# Patient Record
Sex: Male | Born: 1970 | Race: Black or African American | Hispanic: No | Marital: Single | Smoking: Current every day smoker
Health system: Southern US, Community
[De-identification: ages and names within clinical notes are randomized; demographics above are authoritative.]

## PROBLEM LIST (undated history)

## (undated) DIAGNOSIS — G709 Myoneural disorder, unspecified: Secondary | ICD-10-CM

## (undated) DIAGNOSIS — M1990 Unspecified osteoarthritis, unspecified site: Secondary | ICD-10-CM

## (undated) HISTORY — DX: Unspecified osteoarthritis, unspecified site: M19.90

---

## 2005-06-25 HISTORY — PX: KNEE SURGERY: SHX244

## 2009-06-25 HISTORY — PX: KNEE SURGERY: SHX244

## 2010-06-25 HISTORY — PX: KNEE SURGERY: SHX244

## 2011-01-09 ENCOUNTER — Emergency Department: Payer: Self-pay | Admitting: Emergency Medicine

## 2011-02-05 ENCOUNTER — Ambulatory Visit: Payer: Self-pay

## 2014-12-24 DIAGNOSIS — M1990 Unspecified osteoarthritis, unspecified site: Secondary | ICD-10-CM | POA: Insufficient documentation

## 2014-12-29 ENCOUNTER — Encounter: Payer: Self-pay | Admitting: Family Medicine

## 2014-12-29 ENCOUNTER — Ambulatory Visit (INDEPENDENT_AMBULATORY_CARE_PROVIDER_SITE_OTHER): Payer: Medicare Other | Admitting: Family Medicine

## 2014-12-29 VITALS — BP 143/88 | HR 71 | Temp 98.7°F | Wt 169.0 lb

## 2014-12-29 DIAGNOSIS — R195 Other fecal abnormalities: Secondary | ICD-10-CM | POA: Insufficient documentation

## 2014-12-29 DIAGNOSIS — R634 Abnormal weight loss: Secondary | ICD-10-CM | POA: Diagnosis not present

## 2014-12-29 DIAGNOSIS — R030 Elevated blood-pressure reading, without diagnosis of hypertension: Secondary | ICD-10-CM | POA: Diagnosis not present

## 2014-12-29 DIAGNOSIS — M2391 Unspecified internal derangement of right knee: Secondary | ICD-10-CM | POA: Insufficient documentation

## 2014-12-29 DIAGNOSIS — G4701 Insomnia due to medical condition: Secondary | ICD-10-CM | POA: Diagnosis not present

## 2014-12-29 DIAGNOSIS — G8929 Other chronic pain: Secondary | ICD-10-CM | POA: Insufficient documentation

## 2014-12-29 DIAGNOSIS — I10 Essential (primary) hypertension: Secondary | ICD-10-CM

## 2014-12-29 DIAGNOSIS — K30 Functional dyspepsia: Secondary | ICD-10-CM | POA: Diagnosis not present

## 2014-12-29 DIAGNOSIS — IMO0001 Reserved for inherently not codable concepts without codable children: Secondary | ICD-10-CM | POA: Insufficient documentation

## 2014-12-29 MED ORDER — TRAZODONE HCL 50 MG PO TABS: ORAL_TABLET | ORAL | Status: DC

## 2014-12-29 MED ORDER — GABAPENTIN 100 MG PO CAPS: ORAL_CAPSULE | ORAL | Status: DC

## 2014-12-29 NOTE — Assessment & Plan Note (Signed)
Start trazodone, 50 mg and then increase to 100 mg if needed; avoid caffeine; do not mix with alcohol or sleeping pills

## 2014-12-29 NOTE — Assessment & Plan Note (Signed)
Sees Dr. Johny Blamer; will start gabapentin for pain; offered referral to pain clinic and he politely declined; he will see Dr. Johny Blamer; explained never stop gabapentin abruptly, would need taper down when down

## 2014-12-29 NOTE — Progress Notes (Signed)
BP 143/88 mmHg  Pulse 71  Temp(Src) 98.7 F (37.1 C)  Wt 169 lb (76.658 kg)  SpO2 98%   Subjective:    Patient ID: Timothy Mcknight, male    DOB: 05/25/1971, 44 y.o.   MRN: [id number]  HPI: Timothy Mcknight is a 44 y.o. male  Chief Complaint  Patient presents with  . Knee Pain  . Insomnia    due to knee pain   He is constantly in pain; can't sleep like he used to because of the pain Pain wakes him up at night; he can't ever get comfortable enough to relax Thinks the pain is running his BP up; does not check his BP away from her Currently using voltaren gel; it's very temporary; lasts for a few hours and has to put it on again; using it about four times a day he can't eat like his used to, gaining weight but hard to eat sometimes; food does not stick; no heartburn; just a little stomach ache; likes to eat veggies Stools have been runny; stools are smaller in caliber; good BM today; most of the time they are like pebbles; dark and little; no family hx of colon problems No personal history of ulcer; no issues with acid reflux or heartburn Dr. Johny Blamer is his orthopaedist; he did the injections, but it made him worse; the physical therapy causes pain in the groin; he has been through this for fifteen years now; he says the foot gets numb when standing up too long He does not fry food, boils food; not adding salt; no decongestants; smokes, picked up on the cigarettes, try to limit to 1/2 ppd, but stress...; stress is a killer; he can stop smoking at dark Mother has high blood pressure; taking medicine for years He wants to take something for sleep; they used to give him pills that knocked him out, something knocked him out  Relevant past medical, surgical, family and social history reviewed and updated as indicated. Interim medical history since our last visit reviewed. Allergies and medications reviewed and updated.  Review of Systems  Per HPI unless specifically indicated above      Objective:    BP 143/88 mmHg  Pulse 71  Temp(Src) 98.7 F (37.1 C)  Wt 169 lb (76.658 kg)  SpO2 98%  Wt Readings from Last 3 Encounters:  12/29/14 169 lb (76.658 kg)  08/04/14 185 lb (83.915 kg)    Physical Exam  Constitutional: He appears well-developed and well-nourished. No distress.  HENT:  Head: Normocephalic.  Eyes: EOM are normal. No scleral icterus.  Neck: No thyromegaly present.  Cardiovascular: Normal rate and regular rhythm.   No murmur heard. Pulmonary/Chest: Effort normal and breath sounds normal. No respiratory distress.  Abdominal: Soft. Bowel sounds are normal. He exhibits no distension. There is no tenderness. There is no guarding.  Musculoskeletal: He exhibits no edema.  Brace on the right knee; crepitus with flexion and extension; limited extension; no effusion; brace note removed  Neurological: He is alert.  Skin: Skin is warm and dry. He is not diaphoretic. No pallor.  Psychiatric: He has a normal mood and affect. His behavior is normal. Judgment and thought content normal.    No results found for this or any previous visit.    Assessment & Plan:   Problem List Items Addressed This Visit      Musculoskeletal and Integument   Internal derangement of right knee    Sees Dr. Johny Blamer; will start gabapentin for pain;  offered referral to pain clinic and he politely declined; he will see Dr. Johny Blamer; explained never stop gabapentin abruptly, would need taper down when down        Other   Unintentional weight loss    Patient not sure if last weight of 185 pounds was correct; he thinks he has actually gained weight; will check TSH      Relevant Orders   Helicobacter Pylori Antibody, IGM   H. pylori antibody, IgG   H. 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 pylori antibody, IgG   H. pylori antibody, IgA   Comprehensive metabolic panel   TSH   Ambulatory referral to Gastroenterology   Stomach upset    With possible weight loss (we're not sure about reliability of last weight entered); abnormal stools; H pylori testing; refer to GI; on topical NSAID      Relevant Orders   Helicobacter Pylori Antibody, IGM   H. pylori antibody, IgG   H. pylori antibody, IgA   Comprehensive metabolic panel   TSH   Ambulatory referral to Gastroenterology   Elevated systolic blood pressure    May be related to his pain and poor sleep; no meds today; DASH guidelines; reassess at f/u          Follow up plan: Return in about 1 month (around 01/29/2015) for insomnia, blood pressure, stomach issues. Orders Placed This Encounter  Procedures  . Helicobacter Pylori Antibody, IGM  . H. pylori antibody, IgG  . H. pylori antibody, IgA  . CBC with Differential/Platelet  . Comprehensive metabolic panel    Order Specific Question:  Has the patient fasted?    Answer:  Yes  . TSH  . Ambulatory referral to Gastroenterology    Referral Priority:  Routine    Referral Type:  Consultation    Referral Reason:  Specialty Services Required    Number of Visits Requested:  1

## 2014-12-29 NOTE — Assessment & Plan Note (Addendum)
Refer to GI for evaluation; male over 54 of African descent, would suggest colonoscopy

## 2014-12-29 NOTE — Patient Instructions (Addendum)
I've put in a referral to the gastroenterologist for you If you have not heard anything from my staff in a week about any orders/referrals/studies from today, please contact us here to follow-up [phone]  Use the gabapentin for pain relief; start with just one pill a day and increase by one pill every three days to max of three pills per day; we can increase further if needed as a team (do not increase this medicine on your own) When the time comes to stop the medicine, we'll decrease it by one pill every three days; work with me on that as well Try the DASH guidelines to help control your blood pressure  Try the trazodone if needed for sleep; avoid caffeine after lunch If and when you are ready to quit smoking, I am here to help if needed Call 1-800-QUIT-NOW for smoking cessation coaching if you'd like (free of charge)  DASH Eating Plan DASH stands for "Dietary Approaches to Stop Hypertension." The DASH eating plan is a healthy eating plan that has been shown to reduce high blood pressure (hypertension). Additional health benefits may include reducing the risk of type 2 diabetes mellitus, heart disease, and stroke. The DASH eating plan may also help with weight loss. WHAT DO I NEED TO KNOW ABOUT THE DASH EATING PLAN? For the DASH eating plan, you will follow these general guidelines:  Choose foods with a percent daily value for sodium of less than 5% (as listed on the food label).  Use salt-free seasonings or herbs instead of table salt or sea salt.  Check with your health care provider or pharmacist before using salt substitutes.  Eat lower-sodium products, often labeled as "lower sodium" or "no salt added."  Eat fresh foods.  Eat more vegetables, fruits, and low-fat dairy products.  Choose whole grains. Look for the word "whole" as the first word in the ingredient list.  Choose fish and skinless chicken or [place] more often than red meat. Limit fish, poultry, and meat to 6 oz  (170 g) each day.  Limit sweets, desserts, sugars, and sugary drinks.  Choose heart-healthy fats.  Limit cheese to 1 oz (28 g) per day.  Eat more home-cooked food and less restaurant, buffet, and fast food.  Limit fried foods.  Cook foods using methods other than frying.  Limit canned vegetables. If you do use them, rinse them well to decrease the sodium.  When eating at a restaurant, ask that your food be prepared with less salt, or no salt if possible. WHAT FOODS CAN I EAT? Seek help from a dietitian for individual calorie needs. Grains Whole grain or whole wheat bread. Brown rice. Whole grain or whole wheat pasta. Quinoa, bulgur, and whole grain cereals. Low-sodium cereals. Corn or whole wheat flour tortillas. Whole grain cornbread. Whole grain crackers. Low-sodium crackers. Vegetables Fresh or frozen vegetables (raw, steamed, roasted, or grilled). Low-sodium or reduced-sodium tomato and vegetable juices. Low-sodium or reduced-sodium tomato sauce and paste. Low-sodium or reduced-sodium canned vegetables.  Fruits All fresh, canned (in natural juice), or frozen fruits. Meat and Other Protein Products Ground beef (85% or leaner), grass-fed beef, or beef trimmed of fat. Skinless chicken or [place]. Ground chicken or [place]. Pork trimmed of fat. All fish and seafood. Eggs. Dried beans, peas, or lentils. Unsalted nuts and seeds. Unsalted canned beans. Dairy Low-fat dairy products, such as skim or 1% milk, 2% or reduced-fat cheeses, low-fat ricotta or cottage cheese, or plain low-fat yogurt. Low-sodium or reduced-sodium cheeses. Fats and Oils Tub  margarines without trans fats. Light or reduced-fat mayonnaise and salad dressings (reduced sodium). Avocado. Safflower, olive, or canola oils. Natural peanut or almond butter. Other Unsalted popcorn and pretzels. The items listed above may not be a complete list of recommended foods or beverages. Contact your dietitian for more options. WHAT  FOODS ARE NOT RECOMMENDED? Grains White bread. White pasta. White rice. Refined cornbread. Bagels and croissants. Crackers that contain trans fat. Vegetables Creamed or fried vegetables. Vegetables in a cheese sauce. Regular canned vegetables. Regular canned tomato sauce and paste. Regular tomato and vegetable juices. Fruits Dried fruits. Canned fruit in light or heavy syrup. Fruit juice. Meat and Other Protein Products Fatty cuts of meat. Ribs, chicken wings, bacon, sausage, bologna, salami, chitterlings, fatback, hot dogs, bratwurst, and packaged luncheon meats. Salted nuts and seeds. Canned beans with salt. Dairy Whole or 2% milk, cream, half-and-half, and cream cheese. Whole-fat or sweetened yogurt. Full-fat cheeses or blue cheese. Nondairy creamers and whipped toppings. Processed cheese, cheese spreads, or cheese curds. Condiments Onion and garlic salt, seasoned salt, table salt, and sea salt. Canned and packaged gravies. Worcestershire sauce. Tartar sauce. Barbecue sauce. Teriyaki sauce. Soy sauce, including reduced sodium. Steak sauce. Fish sauce. Oyster sauce. Cocktail sauce. Horseradish. Ketchup and mustard. Meat flavorings and tenderizers. Bouillon cubes. Hot sauce. Tabasco sauce. Marinades. Taco seasonings. Relishes. Fats and Oils Butter, stick margarine, lard, shortening, ghee, and bacon fat. Coconut, palm kernel, or palm oils. Regular salad dressings. Other Pickles and olives. Salted popcorn and pretzels. The items listed above may not be a complete list of foods and beverages to avoid. Contact your dietitian for more information. WHERE CAN I FIND MORE INFORMATION? National [employer], [employer], [employer] Institute: travelstabloid.com Document Released: 05/31/2011 Document Revised: 10/26/2013 Document Reviewed: 04/15/2013 [facility] Patient Information 2015 [place], [place]. This information is not intended to replace advice given to you by your health care  provider. Make sure you discuss any questions you have with your health care provider.

## 2014-12-29 NOTE — Assessment & Plan Note (Signed)
May be related to his pain and poor sleep; no meds today; DASH guidelines; reassess at f/u

## 2014-12-29 NOTE — Assessment & Plan Note (Signed)
Patient not sure if last weight of 185 pounds was correct; he thinks he has actually gained weight; will check TSH

## 2014-12-29 NOTE — Assessment & Plan Note (Signed)
With possible weight loss (we're not sure about reliability of last weight entered); abnormal stools; H pylori testing; refer to GI; on topical NSAID

## 2014-12-30 ENCOUNTER — Encounter: Payer: Self-pay | Admitting: Gastroenterology

## 2014-12-30 LAB — COMPREHENSIVE METABOLIC PANEL
ALK PHOS: 99 IU/L (ref 39–117)
ALT: 12 IU/L (ref 0–44)
AST: 16 IU/L (ref 0–40)
Albumin/Globulin Ratio: 1.7 (ref 1.1–2.5)
Albumin: 4.8 g/dL (ref 3.5–5.5)
BILIRUBIN TOTAL: 1 mg/dL (ref 0.0–1.2)
BUN/Creatinine Ratio: 7 — ABNORMAL LOW (ref 9–20)
BUN: 6 mg/dL (ref 6–24)
CO2: 22 mmol/L (ref 18–29)
Calcium: 9.7 mg/dL (ref 8.7–10.2)
Chloride: 101 mmol/L (ref 97–108)
Creatinine, Ser: 0.86 mg/dL (ref 0.76–1.27)
GFR calc non Af Amer: 104 mL/min/{1.73_m2} (ref >59)
GFR, EST AFRICAN AMERICAN: 120 mL/min/{1.73_m2} (ref >59)
Globulin, Total: 2.8 g/dL (ref 1.5–4.5)
Glucose: 111 mg/dL — ABNORMAL HIGH (ref 65–99)
POTASSIUM: 4 mmol/L (ref 3.5–5.2)
SODIUM: 141 mmol/L (ref 134–144)
TOTAL PROTEIN: 7.6 g/dL (ref 6.0–8.5)

## 2014-12-30 LAB — CBC WITH DIFFERENTIAL/PLATELET
Basophils Absolute: 0 10*3/uL (ref 0.0–0.2)
Basos: 0 %
EOS (ABSOLUTE): 0 10*3/uL (ref 0.0–0.4)
EOS: 1 %
Hematocrit: 45.4 % (ref 37.5–51.0)
Hemoglobin: 15.8 g/dL (ref 12.6–17.7)
IMMATURE GRANS (ABS): 0 10*3/uL (ref 0.0–0.1)
IMMATURE GRANULOCYTES: 0 %
Lymphocytes Absolute: 1.8 10*3/uL (ref 0.7–3.1)
Lymphs: 22 %
MCH: 31.1 pg (ref 26.6–33.0)
MCHC: 34.8 g/dL (ref 31.5–35.7)
MCV: 89 fL (ref 79–97)
MONOCYTES: 6 %
MONOS ABS: 0.5 10*3/uL (ref 0.1–0.9)
NEUTROS PCT: 71 %
Neutrophils Absolute: 5.8 10*3/uL (ref 1.4–7.0)
PLATELETS: 222 10*3/uL (ref 150–379)
RBC: 5.08 x10E6/uL (ref 4.14–5.80)
RDW: 14.7 % (ref 12.3–15.4)
WBC: 8.2 10*3/uL (ref 3.4–10.8)

## 2014-12-30 LAB — H. PYLORI ANTIBODY, IGG: H Pylori IgG: <0.9 U/mL (ref 0.0–0.8)

## 2014-12-30 LAB — TSH: TSH: 1.34 u[IU]/mL (ref 0.450–4.500)

## 2014-12-31 LAB — H. PYLORI ANTIBODY, IGA

## 2015-01-03 ENCOUNTER — Telehealth: Payer: Self-pay | Admitting: Family Medicine

## 2015-01-03 DIAGNOSIS — B9681 Helicobacter pylori [H. pylori] as the cause of diseases classified elsewhere: Secondary | ICD-10-CM

## 2015-01-03 DIAGNOSIS — K2970 Gastritis, unspecified, without bleeding: Principal | ICD-10-CM

## 2015-01-03 LAB — HELICOBACTER PYLORI  ANTIBODY, IGM: H. PYLORI, IGM ABS: 11.2 U — AB (ref 0.0–8.9)

## 2015-01-03 MED ORDER — CLARITHROMYCIN 500 MG PO TABS: 500.0000 mg | ORAL_TABLET | Freq: Two times a day (BID) | ORAL | Status: DC

## 2015-01-03 MED ORDER — OMEPRAZOLE 40 MG PO CPDR: 40.0000 mg | DELAYED_RELEASE_CAPSULE | Freq: Every day | ORAL | Status: AC

## 2015-01-03 MED ORDER — AMOXICILLIN 500 MG PO CAPS: 1000.0000 mg | ORAL_CAPSULE | Freq: Two times a day (BID) | ORAL | Status: DC

## 2015-01-03 NOTE — Telephone Encounter (Signed)
I talked to the patient about his test result, H pylori IgM positive Start antibotics He is in [place], [place] and asked for [address] are [address] so hopefully they will communicate with each other and can transfer the Rx

## 2015-01-18 ENCOUNTER — Ambulatory Visit: Payer: Medicare Other | Admitting: Gastroenterology

## 2015-02-02 ENCOUNTER — Encounter: Payer: Self-pay | Admitting: Family Medicine

## 2015-02-02 ENCOUNTER — Ambulatory Visit (INDEPENDENT_AMBULATORY_CARE_PROVIDER_SITE_OTHER): Payer: Medicare Other | Admitting: Family Medicine

## 2015-02-02 VITALS — BP 142/89 | HR 84 | Temp 99.0°F | Wt 174.0 lb

## 2015-02-02 DIAGNOSIS — G2581 Restless legs syndrome: Secondary | ICD-10-CM | POA: Diagnosis not present

## 2015-02-02 DIAGNOSIS — G478 Other sleep disorders: Secondary | ICD-10-CM | POA: Diagnosis not present

## 2015-02-02 DIAGNOSIS — Z8619 Personal history of other infectious and parasitic diseases: Secondary | ICD-10-CM | POA: Insufficient documentation

## 2015-02-02 DIAGNOSIS — A048 Other specified bacterial intestinal infections: Secondary | ICD-10-CM | POA: Diagnosis not present

## 2015-02-02 DIAGNOSIS — R634 Abnormal weight loss: Secondary | ICD-10-CM | POA: Diagnosis not present

## 2015-02-02 DIAGNOSIS — R030 Elevated blood-pressure reading, without diagnosis of hypertension: Secondary | ICD-10-CM

## 2015-02-02 DIAGNOSIS — G253 Myoclonus: Secondary | ICD-10-CM | POA: Insufficient documentation

## 2015-02-02 DIAGNOSIS — R0683 Snoring: Secondary | ICD-10-CM | POA: Diagnosis not present

## 2015-02-02 DIAGNOSIS — IMO0001 Reserved for inherently not codable concepts without codable children: Secondary | ICD-10-CM

## 2015-02-02 DIAGNOSIS — I10 Essential (primary) hypertension: Secondary | ICD-10-CM

## 2015-02-02 MED ORDER — NYSTATIN-TRIAMCINOLONE 100000-0.1 UNIT/GM-% EX CREA: 1.0000 "application " | TOPICAL_CREAM | Freq: Two times a day (BID) | CUTANEOUS | Status: DC

## 2015-02-02 NOTE — Assessment & Plan Note (Signed)
Reviewed recent labs with him showing positive H pylori IgM, neg IgG; he has completed treatment; he sees GI tomorrow and I fully expect that he'll have EGD (and possibly colonoscopy), at which time biopsy / testing for eradication of the H pylori can be done

## 2015-02-02 NOTE — Patient Instructions (Signed)
Do avoid triggers for stomach acid like caffeine, carbonated drinks, pizza, spaghetti sauce, orange juice, spicy foods, etc. We'll schedule your sleep study and I'll see you back afterwards I hope the gastroenterologist will be able to help [place] with your stomach pain

## 2015-02-02 NOTE — Assessment & Plan Note (Signed)
DASH guidelines; likely related to poor sleep as well

## 2015-02-02 NOTE — Progress Notes (Signed)
BP 142/89 mmHg  Pulse 84  Temp(Src) 99 F (37.2 C)  Wt 174 lb (78.926 kg)  SpO2 98%   Subjective:    Patient ID: Timothy Mcknight, male    DOB: [birth year], 44 y.o.   MRN: [id number]  HPI: Timothy Mcknight is a 44 y.o. male  Chief Complaint  Patient presents with  . Hypertension  . GI Problem    was treated for H Pylori. Now states he feels like his anus is "burning"   Since he has been taking those pills, his anus has been burning; was not going on before the antibiotics; no blood He still has not seen the gastroenterologist yet; rescheduled and he thinks he goes tomorrow No nausea or vomiting; stools look dark Finished all the antibiotics He is at 174 pounds today; up five pounds since last visit He does eat, just sometimes doesn't feel like cooking; might get microwave food; still gets hungry and still enjoys food; no change in smell or taste No constipation or diarrhea He changed his soap, thinking that might help; it did calm it down some with the shea butter; some days just on fire He denies anoreceptive intercourse  Trouble with sleeping; he wakes up shaking some times; nerves get to kicking at night; hard to sleep; kicks and tosses and turns every hour; still; constantly kicking and also chokes in his sleep; used to snore for sure, might still snore; wakes himself up coughing some nights   Relevant past medical, surgical, family and social history reviewed and updated as indicated. Interim medical history since our last visit reviewed. Allergies and medications reviewed and updated.  Review of Systems Per HPI unless specifically indicated above     Objective:    BP 142/89 mmHg  Pulse 84  Temp(Src) 99 F (37.2 C)  Wt 174 lb (78.926 kg)  SpO2 98%  Wt Readings from Last 3 Encounters:  02/02/15 174 lb (78.926 kg)  12/29/14 169 lb (76.658 kg)  08/04/14 185 lb (83.915 kg)    Physical Exam  Constitutional: He appears well-developed and well-nourished.  Weight  gain five pounds since last visit one month ago  HENT:  Head: Normocephalic and atraumatic.  Mouth/Throat: Mucous membranes are normal.  Cardiovascular: Normal rate and regular rhythm.   Pulmonary/Chest: Effort normal and breath sounds normal.  Abdominal: Soft. Bowel sounds are normal. There is no tenderness.  Genitourinary: Rectal exam shows no external hemorrhoid, no internal hemorrhoid, no fissure, no mass, no tenderness and anal tone normal.  Skin is erythematous and a little irritated; no frank discrete margin; no vesicles  Neurological: He is alert.  Skin: Skin is warm and dry. He is not diaphoretic. No pallor.  Psychiatric: He has a normal mood and affect. His behavior is normal. Cognition and memory are normal.    Results for orders placed or performed in visit on 23/53/61  Helicobacter Pylori Antibody, IGM  Result Value Ref Range   H. pylori, IgM Abs 11.2 (H) 0.0 - 8.9 units  H. pylori antibody, IgG  Result Value Ref Range   H Pylori IgG <0.9 0.0 - 0.8 U/mL  H. pylori antibody, IgA  Result Value Ref Range   H. pylori, IgA Abs <9.0 0.0 - 8.9 units  CBC with Differential/Platelet  Result Value Ref Range   WBC 8.2 3.4 - 10.8 x10E3/uL   RBC 5.08 4.14 - 5.80 x10E6/uL   Hemoglobin 15.8 12.6 - 17.7 g/dL   Hematocrit 45.4 37.5 - 51.0 %  MCV 89 79 - 97 fL   MCH 31.1 26.6 - 33.0 pg   MCHC 34.8 31.5 - 35.7 g/dL   RDW 14.7 12.3 - 15.4 %   Platelets 222 150 - 379 x10E3/uL   Neutrophils 71 %   Lymphs 22 %   Monocytes 6 %   Eos 1 %   Basos 0 %   Neutrophils Absolute 5.8 1.4 - 7.0 x10E3/uL   Lymphocytes Absolute 1.8 0.7 - 3.1 x10E3/uL   Monocytes Absolute 0.5 0.1 - 0.9 x10E3/uL   EOS (ABSOLUTE) 0.0 0.0 - 0.4 x10E3/uL   Basophils Absolute 0.0 0.0 - 0.2 x10E3/uL   Immature Granulocytes 0 %   Immature Grans (Abs) 0.0 0.0 - 0.1 x10E3/uL  Comprehensive metabolic panel  Result Value Ref Range   Glucose 111 (H) 65 - 99 mg/dL   BUN 6 6 - 24 mg/dL   Creatinine, Ser 0.86 0.76 -  1.27 mg/dL   GFR calc non Af Amer 104 >59 mL/min/1.73   GFR calc Af Amer 120 >59 mL/min/1.73   BUN/Creatinine Ratio 7 (L) 9 - 20   Sodium 141 134 - 144 mmol/L   Potassium 4.0 3.5 - 5.2 mmol/L   Chloride 101 97 - 108 mmol/L   CO2 22 18 - 29 mmol/L   Calcium 9.7 8.7 - 10.2 mg/dL   Total Protein 7.6 6.0 - 8.5 g/dL   Albumin 4.8 3.5 - 5.5 g/dL   Globulin, Total 2.8 1.5 - 4.5 g/dL   Albumin/Globulin Ratio 1.7 1.1 - 2.5   Bilirubin Total 1.0 0.0 - 1.2 mg/dL   Alkaline Phosphatase 99 39 - 117 IU/L   AST 16 0 - 40 IU/L   ALT 12 0 - 44 IU/L  TSH  Result Value Ref Range   TSH 1.340 0.450 - 4.500 uIU/mL      Assessment & Plan:   Problem List Items Addressed This Visit      Digestive   Helicobacter pylori gastrointestinal tract infection    Reviewed recent labs with him showing positive H pylori IgM, neg IgG; he has completed treatment; he sees GI tomorrow and I fully expect that he'll have EGD (and possibly colonoscopy), at which time biopsy / testing for eradication of the H pylori can be done        Nervous and Auditory   Myoclonus    Nocturnal myoclonus; get sleep study; expect medication may be helpful but I don't want to start medicine prior to test      Relevant Orders   Nocturnal polysomnography (NPSG)     Other   Unintentional weight loss    Patient has regained five pounds      Elevated systolic blood pressure    DASH guidelines; likely related to poor sleep as well      Restless leg syndrome - Primary    Will get sleep study; no medicine prior to test, but expect that will be helpful once documented and quantified      Relevant Orders   Nocturnal polysomnography (NPSG)   Snoring   Relevant Orders   Nocturnal polysomnography (NPSG)   Poor sleep pattern   Relevant Orders   Nocturnal polysomnography (NPSG)      Follow up plan: No Follow-up on file.  An after-visit summary was printed and given to the patient at [place].  Please see the patient  instructions which may contain other information and recommendations beyond what is mentioned above in the assessment and plan.  Orders Placed This Encounter  Procedures  . Nocturnal polysomnography (NPSG)   Meds ordered this encounter  Medications  . nystatin-triamcinolone (MYCOLOG II) cream    Sig: Apply 1 application topically 2 (two) times daily.    Dispense:  30 g    Refill:  0

## 2015-02-02 NOTE — Assessment & Plan Note (Signed)
Patient has regained five pounds

## 2015-02-02 NOTE — Assessment & Plan Note (Signed)
Nocturnal myoclonus; get sleep study; expect medication may be helpful but I don't want to start medicine prior to test

## 2015-02-02 NOTE — Assessment & Plan Note (Addendum)
Will get sleep study; no medicine prior to test, but expect that will be helpful once documented and quantified

## 2015-02-03 ENCOUNTER — Other Ambulatory Visit: Payer: Self-pay

## 2015-02-03 ENCOUNTER — Encounter: Payer: Self-pay | Admitting: Gastroenterology

## 2015-02-03 ENCOUNTER — Telehealth: Payer: Self-pay | Admitting: Family Medicine

## 2015-02-03 ENCOUNTER — Ambulatory Visit (INDEPENDENT_AMBULATORY_CARE_PROVIDER_SITE_OTHER): Payer: Medicare Other | Admitting: Gastroenterology

## 2015-02-03 VITALS — BP 136/86 | HR 74 | Temp 99.2°F | Ht 74.0 in | Wt 172.0 lb

## 2015-02-03 DIAGNOSIS — Q400 Congenital hypertrophic pyloric stenosis: Secondary | ICD-10-CM

## 2015-02-03 MED ORDER — NYSTATIN 100000 UNIT/GM EX CREA: 1.0000 "application " | TOPICAL_CREAM | Freq: Three times a day (TID) | CUTANEOUS | Status: DC

## 2015-02-03 NOTE — Progress Notes (Addendum)
Gastroenterology Consultation  Referring Provider:     Arnetha Courser, MD Primary Care Physician:  Enid Derry, MD Primary Gastroenterologist:  Dr. Allen Norris     Reason for Consultation:     H. pylori        HPI:   Timothy Mcknight is a 44 y.o. y/o male referred for consultation & management of H. pylori by Dr. Enid Derry, MD.  As patient comes in today with a history of being found to have H. pylori antibody positive. The patient was treated with medication for this from checking his chart it appears that the patient was treated for 10 days although the patient states he took it for 30 days. The patient is not aware of how may pills he took a day but was adamant that he had enough to last 30 days. The patient has not been tested for eradication of the bacteria. The patient also reports that since taking antibiotics he has been having anal pruritus and he has also a family history of a mother with colon polyps. The patient is not having any change in bowel habits diarrhea constipation nausea or vomiting. The patient denies knowing why he had the H. pylori test in the first place and states he has been doing well. He does have knee pain and walks with a cane.  Past Medical History  Diagnosis Date  . Arthritis     knees    Past Surgical History  Procedure Laterality Date  . Knee surgery  2007  . Knee surgery  2011    graph, pins, bolts  . Knee surgery  2012    Prior to Admission medications   Medication Sig Start Date End Date Taking? Authorizing Provider  diclofenac sodium (VOLTAREN) 1 % GEL Apply 2 g topically 4 (four) times daily as needed.   Yes Historical Provider, MD  gabapentin (NEURONTIN) 100 MG capsule One by mouth at bedtime for 3 nights, then one BID for 3 days, then one TID for 3 days 12/29/14  Yes Arnetha Courser, MD  omeprazole (PRILOSEC) 40 MG capsule Take 1 capsule (40 mg total) by mouth daily. 01/03/15  Yes Arnetha Courser, MD  traZODone (DESYREL) 50 MG tablet One or two  pills by mouth before bed 12/29/14  Yes Arnetha Courser, MD  nystatin-triamcinolone (MYCOLOG II) cream Apply 1 application topically 2 (two) times daily. Patient not taking: Reported on 02/03/2015 02/02/15   Arnetha Courser, MD    Family History  Problem Relation Age of Onset  . Diabetes Mother   . Hypertension Mother   . Heart disease Father      Social History  Substance Use Topics  . Smoking status: Current Every Day Smoker -- 1.00 packs/day    Types: Cigarettes  . Smokeless tobacco: Never Used  . Alcohol Use: Yes     Comment: occasionally    Allergies as of 02/03/2015  . (No Known Allergies)    Review of Systems:    All systems reviewed and negative except where noted in HPI.   Physical Exam:  BP 136/86 mmHg  Pulse 74  Temp(Src) 99.2 F (37.3 C) (Oral)  Ht 6\' 2"  (1.88 m)  Wt 172 lb (78.019 kg)  BMI 22.07 kg/m2 No LMP for male patient. Psych:  Alert and cooperative. Normal mood and affect. General:   Alert,  Well-developed, well-nourished, pleasant and cooperative in NAD Head:  Normocephalic and atraumatic. Eyes:  Sclera clear, no icterus.   Conjunctiva pink.  Ears:  Normal auditory acuity. Nose:  No deformity, discharge, or lesions. Mouth:  No deformity or lesions,oropharynx pink & moist. Neck:  Supple; no masses or thyromegaly. Lungs:  Respirations even and unlabored.  Clear throughout to auscultation.   No wheezes, crackles, or rhonchi. No acute distress. Heart:  Regular rate and rhythm; no murmurs, clicks, rubs, or gallops. Abdomen:  Normal bowel sounds.  No bruits.  Soft, non-tender and non-distended without masses, hepatosplenomegaly or hernias noted.  No guarding or rebound tenderness.  Negative Carnett sign.   Rectal:  Deferred.  Msk:  Symmetrical without gross deformities.  Good, equal movement & strength bilaterally. Pulses:  Normal pulses noted. Extremities:  No clubbing or edema.  No cyanosis. Neurologic:  Alert and oriented x3;  grossly normal  neurologically. Skin:  Intact without significant lesions or rashes.  No jaundice. Lymph Nodes:  No significant cervical adenopathy. Psych:  Alert and cooperative. Normal mood and affect.  Imaging Studies: No results found.  Assessment and Plan:   Timothy Mcknight is a 44 y.o. y/o male who was h.pylori positive and treated by his primary care provider. The patient states he took the treatment for 30 days although looking at the prescription written there is only a 10 day supply given. The patient also has a report of anal pruritus and he has been told to get some nystatin cream to treat this itching. He will also be set up for colonoscopy and EGD for screening and for his history of H. Pylori.I have discussed risks & benefits which include, but are not limited to, bleeding, infection, perforation & drug reaction.  The patient agrees with this plan & written consent will be obtained.      Note: This dictation was prepared with Dragon dictation along with smaller phrase technology. Any transcriptional errors that result from this process are unintentional.

## 2015-02-03 NOTE — Telephone Encounter (Signed)
Yes, I understand and have sent in new cream Start three times a day for a few days, then back it down to twice a day

## 2015-02-03 NOTE — Telephone Encounter (Signed)
Routing updated message to provider.

## 2015-02-03 NOTE — Telephone Encounter (Signed)
Routing to provider  

## 2015-02-03 NOTE — Telephone Encounter (Signed)
Patient notified

## 2015-02-03 NOTE — Telephone Encounter (Signed)
Pt called stating he has a yeast infection and demands something can be called in for him. Pt informed they have OTC medications he can use and instructed pt to talk to the pharmacist to see which option is best for him. Pt stated he is "itching like crazy". Thanks.

## 2015-02-03 NOTE — Telephone Encounter (Signed)
Patient called stating that Rx: nystatin-triamcinolone (MYCOLOG II) cream is too much for him and that insurance won't cover it. If Dr. Sanda Klein can write a different Rx, please call patien, thanks.

## 2015-02-04 ENCOUNTER — Other Ambulatory Visit: Payer: Self-pay

## 2015-02-14 NOTE — Discharge Instructions (Signed)

## 2015-02-16 ENCOUNTER — Encounter
Admission: RE | Disposition: A | Discharge status: Home / Self Care | Payer: Self-pay | Source: Ambulatory Visit | Attending: Gastroenterology

## 2015-02-16 ENCOUNTER — Ambulatory Visit
Admission: RE | Admit: 2015-02-16 | Discharge: 2015-02-16 | Disposition: A | Discharge status: Home / Self Care | Payer: Medicare Other | Source: Ambulatory Visit | Attending: Gastroenterology | Admitting: Gastroenterology

## 2015-02-16 ENCOUNTER — Ambulatory Visit: Payer: Medicare Other | Admitting: Anesthesiology

## 2015-02-16 ENCOUNTER — Encounter: Payer: Self-pay | Admitting: *Deleted

## 2015-02-16 ENCOUNTER — Other Ambulatory Visit: Payer: Self-pay | Admitting: Gastroenterology

## 2015-02-16 DIAGNOSIS — R1013 Epigastric pain: Secondary | ICD-10-CM | POA: Insufficient documentation

## 2015-02-16 DIAGNOSIS — Z79899 Other long term (current) drug therapy: Secondary | ICD-10-CM | POA: Insufficient documentation

## 2015-02-16 DIAGNOSIS — M170 Bilateral primary osteoarthritis of knee: Secondary | ICD-10-CM | POA: Diagnosis not present

## 2015-02-16 DIAGNOSIS — K2970 Gastritis, unspecified, without bleeding: Secondary | ICD-10-CM | POA: Diagnosis not present

## 2015-02-16 DIAGNOSIS — Z1211 Encounter for screening for malignant neoplasm of colon: Secondary | ICD-10-CM | POA: Insufficient documentation

## 2015-02-16 DIAGNOSIS — B9681 Helicobacter pylori [H. pylori] as the cause of diseases classified elsewhere: Secondary | ICD-10-CM | POA: Diagnosis not present

## 2015-02-16 DIAGNOSIS — F17210 Nicotine dependence, cigarettes, uncomplicated: Secondary | ICD-10-CM | POA: Insufficient documentation

## 2015-02-16 DIAGNOSIS — K2980 Duodenitis without bleeding: Secondary | ICD-10-CM | POA: Insufficient documentation

## 2015-02-16 DIAGNOSIS — K621 Rectal polyp: Secondary | ICD-10-CM | POA: Insufficient documentation

## 2015-02-16 DIAGNOSIS — K449 Diaphragmatic hernia without obstruction or gangrene: Secondary | ICD-10-CM | POA: Diagnosis not present

## 2015-02-16 DIAGNOSIS — K635 Polyp of colon: Secondary | ICD-10-CM | POA: Diagnosis not present

## 2015-02-16 DIAGNOSIS — K219 Gastro-esophageal reflux disease without esophagitis: Secondary | ICD-10-CM | POA: Diagnosis not present

## 2015-02-16 DIAGNOSIS — D125 Benign neoplasm of sigmoid colon: Secondary | ICD-10-CM | POA: Insufficient documentation

## 2015-02-16 DIAGNOSIS — K2960 Other gastritis without bleeding: Secondary | ICD-10-CM | POA: Diagnosis not present

## 2015-02-16 DIAGNOSIS — K30 Functional dyspepsia: Secondary | ICD-10-CM | POA: Diagnosis not present

## 2015-02-16 HISTORY — PX: POLYPECTOMY: SHX5525

## 2015-02-16 HISTORY — DX: Myoneural disorder, unspecified: G70.9

## 2015-02-16 HISTORY — PX: COLONOSCOPY WITH PROPOFOL: SHX5780

## 2015-02-16 HISTORY — PX: ESOPHAGOGASTRODUODENOSCOPY (EGD) WITH PROPOFOL: SHX5813

## 2015-02-16 SURGERY — COLONOSCOPY WITH PROPOFOL
Anesthesia: Monitor Anesthesia Care | Wound class: Contaminated

## 2015-02-16 MED ORDER — LIDOCAINE HCL (CARDIAC) 20 MG/ML IV SOLN
INTRAVENOUS | Status: DC | PRN
  Administered 2015-02-16: 30 mg via INTRAVENOUS

## 2015-02-16 MED ORDER — OXYCODONE HCL 5 MG PO TABS: 5.0000 mg | ORAL_TABLET | Freq: Once | ORAL | Status: DC | PRN

## 2015-02-16 MED ORDER — GLYCOPYRROLATE 0.2 MG/ML IJ SOLN
INTRAMUSCULAR | Status: DC | PRN
  Administered 2015-02-16: 0.2 mg via INTRAVENOUS

## 2015-02-16 MED ORDER — PROPOFOL 10 MG/ML IV BOLUS
INTRAVENOUS | Status: DC | PRN
  Administered 2015-02-16 (×2): 50 mg via INTRAVENOUS
  Administered 2015-02-16: 100 mg via INTRAVENOUS
  Administered 2015-02-16 (×4): 50 mg via INTRAVENOUS

## 2015-02-16 MED ORDER — LACTATED RINGERS IV SOLN
INTRAVENOUS | Status: DC
  Administered 2015-02-16 (×2): via INTRAVENOUS

## 2015-02-16 MED ORDER — OXYCODONE HCL 5 MG/5ML PO SOLN: 5.0000 mg | Freq: Once | ORAL | Status: DC | PRN

## 2015-02-16 MED ORDER — SIMETHICONE 40 MG/0.6ML PO SUSP
ORAL | Status: DC | PRN
  Administered 2015-02-16: 09:00:00

## 2015-02-16 SURGICAL SUPPLY — 39 items
BALLN DILATOR 10-12 8 (BALLOONS)
BALLN DILATOR 12-15 8 (BALLOONS)
BALLN DILATOR 15-18 8 (BALLOONS)
BALLN DILATOR CRE 0-12 8 (BALLOONS)
BALLN DILATOR ESOPH 8 10 CRE (MISCELLANEOUS) IMPLANT
BALLOON DILATOR 12-15 8 (BALLOONS) IMPLANT
BALLOON DILATOR 15-18 8 (BALLOONS) IMPLANT
BALLOON DILATOR CRE 0-12 8 (BALLOONS) IMPLANT
BLOCK BITE 60FR ADLT L/F GRN (MISCELLANEOUS) ×4 IMPLANT
CANISTER SUCT 1200ML W/VALVE (MISCELLANEOUS) ×4 IMPLANT
FCP ESCP3.2XJMB 240X2.8X (MISCELLANEOUS)
FORCEPS BIOP RAD 4 LRG CAP 4 (CUTTING FORCEPS) ×4 IMPLANT
FORCEPS BIOP RJ4 240 W/NDL (MISCELLANEOUS)
FORCEPS ESCP3.2XJMB 240X2.8X (MISCELLANEOUS) IMPLANT
GOWN CVR UNV OPN BCK APRN NK (MISCELLANEOUS) ×4 IMPLANT
GOWN ISOL THUMB LOOP REG UNIV (MISCELLANEOUS) ×4
HEMOCLIP INSTINCT (CLIP) IMPLANT
INJECTOR VARIJECT VIN23 (MISCELLANEOUS) IMPLANT
KIT CO2 TUBING (TUBING) IMPLANT
KIT DEFENDO VALVE AND CONN (KITS) IMPLANT
KIT ENDO PROCEDURE OLY (KITS) ×4 IMPLANT
LIGATOR MULTIBAND 6SHOOTER MBL (MISCELLANEOUS) IMPLANT
MARKER SPOT ENDO TATTOO 5ML (MISCELLANEOUS) IMPLANT
PAD GROUND ADULT SPLIT (MISCELLANEOUS) IMPLANT
SNARE SHORT THROW 13M SML OVAL (MISCELLANEOUS) IMPLANT
SNARE SHORT THROW 30M LRG OVAL (MISCELLANEOUS) IMPLANT
SPOT EX ENDOSCOPIC TATTOO (MISCELLANEOUS)
SUCTION POLY TRAP 4CHAMBER (MISCELLANEOUS) IMPLANT
SYR INFLATION 60ML (SYRINGE) IMPLANT
TRAP SUCTION POLY (MISCELLANEOUS) IMPLANT
TUBING CONN 6MMX3.1M (TUBING)
TUBING SUCTION CONN 0.25 STRL (TUBING) IMPLANT
UNDERPAD 30X60 958B10 (PK) (MISCELLANEOUS) IMPLANT
VALVE BIOPSY ENDO (VALVE) IMPLANT
VARIJECT INJECTOR VIN23 (MISCELLANEOUS)
WATER AUXILLARY (MISCELLANEOUS) IMPLANT
WATER STERILE IRR 250ML POUR (IV SOLUTION) ×4 IMPLANT
WATER STERILE IRR 500ML POUR (IV SOLUTION) IMPLANT
WIRE CRE 18-20MM 8CM F G (MISCELLANEOUS) IMPLANT

## 2015-02-16 NOTE — Anesthesia Procedure Notes (Signed)
Procedure Name: MAC Performed by: Filiberto Wamble Pre-anesthesia Checklist: Patient identified, Emergency Drugs available, Suction available, Patient being monitored and Timeout performed Patient Re-evaluated:Patient Re-evaluated prior to inductionOxygen Delivery Method: Nasal cannula Preoxygenation: Pre-oxygenation with 100% oxygen       

## 2015-02-16 NOTE — Transfer of Care (Signed)
Immediate Anesthesia Transfer of Care Note  Patient: Timothy Mcknight  Procedure(s) Performed: Procedure(s): COLONOSCOPY WITH PROPOFOL (N/A) ESOPHAGOGASTRODUODENOSCOPY (EGD) WITH PROPOFOL (N/A)  Patient Location: PACU  Anesthesia Type: MAC  Level of Consciousness: awake, alert  and patient cooperative  Airway and Oxygen Therapy: Patient Spontanous Breathing and Patient connected to supplemental oxygen  Post-op Assessment: Post-op Vital signs reviewed, Patient's Cardiovascular Status Stable, Respiratory Function Stable, Patent Airway and No signs of Nausea or vomiting  Post-op Vital Signs: Reviewed and stable  Complications: No apparent anesthesia complications

## 2015-02-16 NOTE — Anesthesia Postprocedure Evaluation (Signed)
  Anesthesia Post-op Note  Patient: Timothy Mcknight  Procedure(s) Performed: Procedure(s) with comments: COLONOSCOPY WITH PROPOFOL (N/A) ESOPHAGOGASTRODUODENOSCOPY (EGD) WITH PROPOFOL (N/A) POLYPECTOMY - sigmoid polyp, rectal polyp, duodenum biopsy, gastric biopsy  Anesthesia type:MAC  Patient location: PACU  Post pain: Pain level controlled  Post assessment: Post-op Vital signs reviewed, Patient's Cardiovascular Status Stable, Respiratory Function Stable, Patent Airway and No signs of Nausea or vomiting  Post vital signs: Reviewed and stable  Last Vitals:  Filed Vitals:   02/16/15 0915  BP:   Pulse:   Temp: 36.5 C  Resp:     Level of consciousness: awake, alert  and patient cooperative  Complications: No apparent anesthesia complications

## 2015-02-16 NOTE — H&P (Signed)
[facility]  [address], [place], [place] [address] Phone: [phone] Fax : [phone]  Primary Care Physician:  Enid Derry, MD Primary Gastroenterologist:  Dr. Allen Norris  Pre-Procedure History & Physical: HPI:  Timothy Mcknight is a 44 y.o. male is here for an endoscopy and colonoscopy.   Past Medical History  Diagnosis Date  . Arthritis     knees  . Neuromuscular disorder     NUMBNESS IN RIGHT FOOT    Past Surgical History  Procedure Laterality Date  . Knee surgery Right 2007  . Knee surgery Right 2011    graph, pins, bolts  . Knee surgery Right 2012    Prior to Admission medications   Medication Sig Start Date End Date Taking? Authorizing Provider  diclofenac sodium (VOLTAREN) 1 % GEL Apply 2 g topically 4 (four) times daily as needed.   Yes Historical Provider, MD  gabapentin (NEURONTIN) 100 MG capsule One by mouth at bedtime for 3 nights, then one BID for 3 days, then one TID for 3 days 12/29/14  Yes Arnetha Courser, MD  nystatin cream (MYCOSTATIN) Apply 1 application topically 3 (three) times daily. 02/03/15  Yes Arnetha Courser, MD  traZODone (DESYREL) 50 MG tablet One or two pills by mouth before bed 12/29/14  Yes Arnetha Courser, MD  omeprazole (PRILOSEC) 40 MG capsule Take 1 capsule (40 mg total) by mouth daily. Patient not taking: Reported on 02/10/2015 01/03/15   Arnetha Courser, MD    Allergies as of 02/04/2015  . (No Known Allergies)    Family History  Problem Relation Age of Onset  . Diabetes Mother   . Hypertension Mother   . Heart disease Father     Social History   Social History  . Marital Status: Single    Spouse Name: N/A  . Number of Children: N/A  . Years of Education: N/A   Occupational History  . Not on file.   Social History Main Topics  . Smoking status: Current Every Day Smoker -- 1.00 packs/day for 20 years    Types: Cigarettes  . Smokeless tobacco: Never Used  . Alcohol Use: 1.8 - 2.4 oz/week    3-4 Cans of  beer per week     Comment: occasionally  . Drug Use: Yes    Special: Marijuana     Comment: USED FOR SLEEP/ NONE FOR 2 MONTHS  . Sexual Activity: Not on file   Other Topics Concern  . Not on file   Social History Narrative    Review of Systems: See HPI, otherwise negative ROS  Physical Exam: BP 148/95 mmHg  Pulse 57  Temp(Src) 97.9 F (36.6 C)  Resp 16  Ht 6\' 2"  (1.88 m)  Wt 175 lb (79.379 kg)  BMI 22.46 kg/m2  SpO2 100% General:   Alert,  pleasant and cooperative in NAD Head:  Normocephalic and atraumatic. Neck:  Supple; no masses or thyromegaly. Lungs:  Clear throughout to auscultation.    Heart:  Regular rate and rhythm. Abdomen:  Soft, nontender and nondistended. Normal bowel sounds, without guarding, and without rebound.   Neurologic:  Alert and  oriented x4;  grossly normal neurologically.  Impression/Plan: Timothy Mcknight is here for an endoscopy and colonoscopy to be performed for h. Pylori and screening  Risks, benefits, limitations, and alternatives regarding  endoscopy and colonoscopy have been reviewed with the patient.  Questions have been answered.  All parties agreeable.   Ollen Bowl, MD  02/16/2015,  8:36 AM

## 2015-02-16 NOTE — Anesthesia Preprocedure Evaluation (Signed)
Anesthesia Evaluation  Patient identified by MRN, date of birth, ID band  Reviewed: NPO status   History of Anesthesia Complications Negative for: history of anesthetic complications  Airway Mallampati: II  TM Distance: >3 FB Neck ROM: full    Dental  (+) Missing,    Pulmonary Current Smoker,    Pulmonary exam normal       Cardiovascular negative cardio ROS Normal cardiovascular exam    Neuro/Psych Neuropathy legs  negative neurological ROS  negative psych ROS   GI/Hepatic Neg liver ROS, GERD-  Controlled,  Endo/Other  negative endocrine ROS  Renal/GU negative Renal ROS  negative genitourinary   Musculoskeletal  (+) Arthritis -,   Abdominal   Peds  Hematology negative hematology ROS (+)   Anesthesia Other Findings   Reproductive/Obstetrics                             Anesthesia Physical Anesthesia Plan  ASA: II  Anesthesia Plan: MAC   Post-op Pain Management:    Induction:   Airway Management Planned:   Additional Equipment:   Intra-op Plan:   Post-operative Plan:   Informed Consent: I have reviewed the patients History and Physical, chart, labs and discussed the procedure including the risks, benefits and alternatives for the proposed anesthesia with the patient or authorized representative who has indicated his/her understanding and acceptance.     Plan Discussed with: CRNA  Anesthesia Plan Comments:         Anesthesia Quick Evaluation

## 2015-02-17 ENCOUNTER — Encounter: Payer: Self-pay | Admitting: Gastroenterology

## 2015-02-17 NOTE — Op Note (Signed)
[facility] Patient Name: Timothy Mcknight Procedure Date: 02/16/2015 8:46 AM MRN: [id number] Account #: [id number] Date of Birth: [birth year] Admit Type: Outpatient Age: 44 Room: [facility] OR ROOM 01 Gender: Male Note Status: Finalized Procedure:         Colonoscopy Indications:       Screening for colorectal malignant neoplasm Providers:         Lucilla Lame, MD Medicines:         Propofol per Anesthesia Complications:     No immediate complications. Procedure:         Pre-Anesthesia Assessment:                    - Prior to the procedure, a History and Physical was                     performed, and patient medications and allergies were                     reviewed. The patient's tolerance of previous anesthesia                     was also reviewed. The risks and benefits of the procedure                     and the sedation options and risks were discussed with the                     patient. All questions were answered, and informed consent                     was obtained. Prior Anticoagulants: The patient has taken                     no previous anticoagulant or antiplatelet agents. ASA                     Grade Assessment: II - A patient with mild systemic                     disease. After reviewing the risks and benefits, the                     patient was deemed in satisfactory condition to undergo                     the procedure.                    After obtaining informed consent, the colonoscope was                     passed under direct vision. Throughout the procedure, the                     patient's blood pressure, pulse, and oxygen saturations                     were monitored continuously. The Olympus CF H180AL                     colonoscope (S#: [id number]) was introduced through the anus                     and advanced to the the cecum, identified by  appendiceal                     orifice and ileocecal valve. The colonoscopy  was performed                     without difficulty. The patient tolerated the procedure                     well. The quality of the bowel preparation was fair. Findings:      The perianal and digital rectal examinations were normal.      Four sessile polyps were found in the sigmoid colon. The polyps were 2       to 5 mm in size. These polyps were removed with a cold biopsy forceps.       Resection and retrieval were complete.      Two sessile polyps were found in the rectum. The polyps were 3 to 4 mm       in size. These polyps were removed with a cold biopsy forceps. Resection       and retrieval were complete.      Stool was found in the cecum. Impression:        - Four 2 to 5 mm polyps in the sigmoid colon. Resected and                     retrieved.                    - Two 3 to 4 mm polyps in the rectum. Resected and                     retrieved.                    - Stool in the cecum. Recommendation:    - Await pathology results.                    - Repeat colonoscopy in 5 years if polyp adenoma and 10                     years if hyperplastic Procedure Code(s): --- Professional ---                    [phone], Colonoscopy, flexible; with biopsy, single or                     multiple Diagnosis Code(s): --- Professional ---                    Z12.11, Encounter for screening for malignant neoplasm of                     colon                    D12.5, Benign neoplasm of sigmoid colon                    K62.1, Rectal polyp CPT copyright 2014 American Medical Association. All rights reserved. The codes documented in this report are preliminary and upon coder review may  be revised to meet current compliance requirements. Lucilla Lame, MD 02/16/2015 9:08:55 AM This report has been signed electronically. Number of Addenda: 0 Note Initiated On: 02/16/2015 8:46 AM Scope Withdrawal Time: 0 hours 6 minutes 37 seconds  Total Procedure Duration: 0 hours 8 minutes 27 seconds        [facility]

## 2015-02-17 NOTE — Op Note (Signed)
[facility] Patient Name: Timothy Mcknight Procedure Date: 02/16/2015 8:46 AM MRN: [id number] Account #: [id number] Date of Birth: [birth year] Admit Type: Outpatient Age: 44 Room: [facility] OR ROOM 01 Gender: Male Note Status: Finalized Procedure:         Upper GI endoscopy Indications:       Dyspepsia, Helicobacter pylori Providers:         Lucilla Lame, MD Referring MD:      Arnetha Courser (Referring MD) Medicines:         Propofol per Anesthesia Complications:     No immediate complications. Procedure:         Pre-Anesthesia Assessment:                    - Prior to the procedure, a History and Physical was                     performed, and patient medications and allergies were                     reviewed. The patient's tolerance of previous anesthesia                     was also reviewed. The risks and benefits of the procedure                     and the sedation options and risks were discussed with the                     patient. All questions were answered, and informed consent                     was obtained. Prior Anticoagulants: The patient has taken                     no previous anticoagulant or antiplatelet agents. ASA                     Grade Assessment: II - A patient with mild systemic                     disease. After reviewing the risks and benefits, the                     patient was deemed in satisfactory condition to undergo                     the procedure.                    After obtaining informed consent, the endoscope was passed                     under direct vision. Throughout the procedure, the                     patient's blood pressure, pulse, and oxygen saturations                     were monitored continuously. The Olympus GIF H180J                     colonscope (L#:[id number]) was introduced through the mouth,  and advanced to the second part of duodenum. The upper GI   endoscopy was accomplished without difficulty. The patient                     tolerated the procedure well. Findings:      A small hiatus hernia was present.      Localized moderate inflammation characterized by erythema was found in       the gastric antrum. Biopsies were taken with a cold forceps for       histology.      Localized moderate inflammation characterized by erythema was found in       the duodenal bulb. Biopsies were taken with a cold forceps for histology. Impression:        - Small hiatus hernia.                    - Gastritis. Biopsied.                    - Duodenitis. Biopsied. Recommendation:    - Await pathology results. Procedure Code(s): --- Professional ---                    [phone], Esophagogastroduodenoscopy, flexible, transoral;                     with biopsy, single or multiple Diagnosis Code(s): --- Professional ---                    K30, Functional dyspepsia                    I77.82, Helicobacter pylori [H. pylori] as the cause of                     diseases classified elsewhere                    K29.80, Duodenitis without bleeding                    K29.70, Gastritis, unspecified, without bleeding                    K44.9, Diaphragmatic hernia without obstruction or gangrene CPT copyright 2014 American Medical Association. All rights reserved. The codes documented in this report are preliminary and upon coder review may  be revised to meet current compliance requirements. Lucilla Lame, MD 02/16/2015 8:58:02 AM This report has been signed electronically. Number of Addenda: 0 Note Initiated On: 02/16/2015 8:46 AM      [facility]

## 2015-02-21 ENCOUNTER — Encounter: Payer: Self-pay | Admitting: Gastroenterology

## 2015-02-23 ENCOUNTER — Encounter: Payer: Self-pay | Admitting: Family Medicine

## 2015-02-23 ENCOUNTER — Ambulatory Visit (INDEPENDENT_AMBULATORY_CARE_PROVIDER_SITE_OTHER): Payer: Medicare Other | Admitting: Family Medicine

## 2015-02-23 VITALS — BP 141/79 | HR 72 | Temp 99.0°F | Wt 178.0 lb

## 2015-02-23 DIAGNOSIS — J029 Acute pharyngitis, unspecified: Secondary | ICD-10-CM | POA: Diagnosis not present

## 2015-02-23 DIAGNOSIS — IMO0001 Reserved for inherently not codable concepts without codable children: Secondary | ICD-10-CM

## 2015-02-23 DIAGNOSIS — M2391 Unspecified internal derangement of right knee: Secondary | ICD-10-CM | POA: Diagnosis not present

## 2015-02-23 DIAGNOSIS — R030 Elevated blood-pressure reading, without diagnosis of hypertension: Secondary | ICD-10-CM | POA: Diagnosis not present

## 2015-02-23 DIAGNOSIS — M25461 Effusion, right knee: Secondary | ICD-10-CM | POA: Diagnosis not present

## 2015-02-23 DIAGNOSIS — I10 Essential (primary) hypertension: Secondary | ICD-10-CM

## 2015-02-23 MED ORDER — MAGIC MOUTHWASH: 5.0000 mL | Freq: Four times a day (QID) | ORAL | Status: AC | PRN

## 2015-02-23 NOTE — Patient Instructions (Addendum)
Try the DASH guidelines to help with your high blood pressure Poor sleep quality can increase blood pressure Okay to take two of the 50 mg trazodone pills (to equal 100 mg) to help you sleep since you have chronic insomnia Continue healthy sleep hygiene We'll refer you to an orthopaedist about your knee If you have not heard anything from my staff in a week about any orders/referrals/studies from today, please contact us here to follow-up [phone] Try the new mouthwash for the throat symptoms and let me know if not effective  DASH Eating Plan DASH stands for "Dietary Approaches to Stop Hypertension." The DASH eating plan is a healthy eating plan that has been shown to reduce high blood pressure (hypertension). Additional health benefits may include reducing the risk of type 2 diabetes mellitus, heart disease, and stroke. The DASH eating plan may also help with weight loss. WHAT DO I NEED TO KNOW ABOUT THE DASH EATING PLAN? For the DASH eating plan, you will follow these general guidelines:  Choose foods with a percent daily value for sodium of less than 5% (as listed on the food label).  Use salt-free seasonings or herbs instead of table salt or sea salt.  Check with your health care provider or pharmacist before using salt substitutes.  Eat lower-sodium products, often labeled as "lower sodium" or "no salt added."  Eat fresh foods.  Eat more vegetables, fruits, and low-fat dairy products.  Choose whole grains. Look for the word "whole" as the first word in the ingredient list.  Choose fish and skinless chicken or [place] more often than red meat. Limit fish, poultry, and meat to 6 oz (170 g) each day.  Limit sweets, desserts, sugars, and sugary drinks.  Choose heart-healthy fats.  Limit cheese to 1 oz (28 g) per day.  Eat more home-cooked food and less restaurant, buffet, and fast food.  Limit fried foods.  Cook foods using methods other than frying.  Limit canned  vegetables. If you do use them, rinse them well to decrease the sodium.  When eating at a restaurant, ask that your food be prepared with less salt, or no salt if possible. WHAT FOODS CAN I EAT? Seek help from a dietitian for individual calorie needs. Grains Whole grain or whole wheat bread. Brown rice. Whole grain or whole wheat pasta. Quinoa, bulgur, and whole grain cereals. Low-sodium cereals. Corn or whole wheat flour tortillas. Whole grain cornbread. Whole grain crackers. Low-sodium crackers. Vegetables Fresh or frozen vegetables (raw, steamed, roasted, or grilled). Low-sodium or reduced-sodium tomato and vegetable juices. Low-sodium or reduced-sodium tomato sauce and paste. Low-sodium or reduced-sodium canned vegetables.  Fruits All fresh, canned (in natural juice), or frozen fruits. Meat and Other Protein Products Ground beef (85% or leaner), grass-fed beef, or beef trimmed of fat. Skinless chicken or [place]. Ground chicken or [place]. Pork trimmed of fat. All fish and seafood. Eggs. Dried beans, peas, or lentils. Unsalted nuts and seeds. Unsalted canned beans. Dairy Low-fat dairy products, such as skim or 1% milk, 2% or reduced-fat cheeses, low-fat ricotta or cottage cheese, or plain low-fat yogurt. Low-sodium or reduced-sodium cheeses. Fats and Oils Tub margarines without trans fats. Light or reduced-fat mayonnaise and salad dressings (reduced sodium). Avocado. Safflower, olive, or canola oils. Natural peanut or almond butter. Other Unsalted popcorn and pretzels. The items listed above may not be a complete list of recommended foods or beverages. Contact your dietitian for more options. WHAT FOODS ARE NOT RECOMMENDED? Grains White bread. White pasta. White  rice. Refined cornbread. Bagels and croissants. Crackers that contain trans fat. Vegetables Creamed or fried vegetables. Vegetables in a cheese sauce. Regular canned vegetables. Regular canned tomato sauce and paste. Regular tomato  and vegetable juices. Fruits Dried fruits. Canned fruit in light or heavy syrup. Fruit juice. Meat and Other Protein Products Fatty cuts of meat. Ribs, chicken wings, bacon, sausage, bologna, salami, chitterlings, fatback, hot dogs, bratwurst, and packaged luncheon meats. Salted nuts and seeds. Canned beans with salt. Dairy Whole or 2% milk, cream, half-and-half, and cream cheese. Whole-fat or sweetened yogurt. Full-fat cheeses or blue cheese. Nondairy creamers and whipped toppings. Processed cheese, cheese spreads, or cheese curds. Condiments Onion and garlic salt, seasoned salt, table salt, and sea salt. Canned and packaged gravies. Worcestershire sauce. Tartar sauce. Barbecue sauce. Teriyaki sauce. Soy sauce, including reduced sodium. Steak sauce. Fish sauce. Oyster sauce. Cocktail sauce. Horseradish. Ketchup and mustard. Meat flavorings and tenderizers. Bouillon cubes. Hot sauce. Tabasco sauce. Marinades. Taco seasonings. Relishes. Fats and Oils Butter, stick margarine, lard, shortening, ghee, and bacon fat. Coconut, palm kernel, or palm oils. Regular salad dressings. Other Pickles and olives. Salted popcorn and pretzels. The items listed above may not be a complete list of foods and beverages to avoid. Contact your dietitian for more information. WHERE CAN I FIND MORE INFORMATION? National Heart, [employer], and [employer] Institute: travelstabloid.com Document Released: 05/31/2011 Document Revised: 10/26/2013 Document Reviewed: 04/15/2013 [facility] Patient Information 2015 [place], [place]. This information is not intended to replace advice given to you by your health care provider. Make sure you discuss any questions you have with your health care provider.

## 2015-02-23 NOTE — Assessment & Plan Note (Addendum)
Dash guidelines; monitor; no medicine at this time

## 2015-02-23 NOTE — Progress Notes (Signed)
BP 141/79 mmHg  Pulse 72  Temp(Src) 99 F (37.2 C)  Wt 178 lb (80.74 kg)  SpO2 99%   Subjective:    Patient ID: Timothy Mcknight, male    DOB: 08/04/1970, 44 y.o.   MRN: [id number]  HPI: Timothy Mcknight is a 44 y.o. male  Chief Complaint  Patient presents with  . Leg Pain    from right knee to ankle. pain and swelling.    He says his bottom is better; he has irritation and was treated with nystatin cream; I offered to check and he said it wasn't necessary; doing well  He had the EGD and colonoscopy; done by Dr. Allen Norris; there are already letters in the chart about the results, but the patient hasn't received those yet  Got dentures; took a nap and forgot to take his teeth out; throat has been bothering him; tongue turns red; wakes up with dry dry throat; no adhesives; he was fine before the episode when he slept in them; no problems with swallowing; no swollen glands in the neck; no problems with dry eyes; no frequent urination; no change in voice; just got front tooth removed and 2nd to last molar on the left removed; [facility] near [employer] near [place]; nose has been stopped up; no fever; ears are okay  His knee has been swelling; about 2 weeks duration; using alcohol with Epsom salt; knee aches; gets so swollen sometimes through the day; wears the coppertone knee brace and also has hinged brace; 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 Interim medical history since our last visit reviewed. Allergies and medications reviewed and updated.  Review of Systems Per HPI unless specifically indicated above     Objective:    BP 141/79 mmHg  Pulse 72  Temp(Src) 99 F (37.2 C)  Wt 178 lb  (80.74 kg)  SpO2 99%  Wt Readings from Last 3 Encounters:  02/23/15 178 lb (80.74 kg)  02/16/15 175 lb (79.379 kg)  02/03/15 172 lb (78.019 kg)    Physical Exam  Constitutional: He appears well-developed and well-nourished. No distress.  HENT:  Head: Normocephalic and atraumatic.  Mouth/Throat: Mucous membranes are normal. Oral lesions (some erosion along buccal mucosa adjacent to tooth 31) present. Posterior oropharyngeal erythema present. No oropharyngeal exudate, posterior oropharyngeal edema or tonsillar abscesses.  Eyes: EOM are normal. No scleral icterus.  Neck: No thyromegaly present.  Cardiovascular: Normal rate and regular rhythm.   Pulmonary/Chest: Effort normal and breath sounds normal.  Abdominal: Soft. Bowel sounds are normal. He exhibits no distension.  Musculoskeletal: He exhibits no edema (no right calf edema).       Right knee: He exhibits decreased range of motion, swelling and effusion. He exhibits no deformity and no erythema. Tenderness found. Medial joint line, lateral joint line and patellar tendon tenderness noted.  Neurological: Coordination normal.  Skin: Skin is warm and dry. No pallor.  Psychiatric: He has a normal mood and affect. His behavior is normal. Judgment and thought content normal.    Results for orders placed or performed in visit on 38/10/17  Helicobacter Pylori Antibody, IGM  Result Value Ref Range   H. pylori, IgM Abs 11.2 (  H) 0.0 - 8.9 units  H. pylori antibody, IgG  Result Value Ref Range   H Pylori IgG <0.9 0.0 - 0.8 U/mL  H. pylori antibody, IgA  Result Value Ref Range   H. pylori, IgA Abs <9.0 0.0 - 8.9 units  CBC with Differential/Platelet  Result Value Ref Range   WBC 8.2 3.4 - 10.8 x10E3/uL   RBC 5.08 4.14 - 5.80 x10E6/uL   Hemoglobin 15.8 12.6 - 17.7 g/dL   Hematocrit 45.4 37.5 - 51.0 %   MCV 89 79 - 97 fL   MCH 31.1 26.6 - 33.0 pg   MCHC 34.8 31.5 - 35.7 g/dL   RDW 14.7 12.3 - 15.4 %   Platelets 222 150 - 379 x10E3/uL    Neutrophils 71 %   Lymphs 22 %   Monocytes 6 %   Eos 1 %   Basos 0 %   Neutrophils Absolute 5.8 1.4 - 7.0 x10E3/uL   Lymphocytes Absolute 1.8 0.7 - 3.1 x10E3/uL   Monocytes Absolute 0.5 0.1 - 0.9 x10E3/uL   EOS (ABSOLUTE) 0.0 0.0 - 0.4 x10E3/uL   Basophils Absolute 0.0 0.0 - 0.2 x10E3/uL   Immature Granulocytes 0 %   Immature Grans (Abs) 0.0 0.0 - 0.1 x10E3/uL  Comprehensive metabolic panel  Result Value Ref Range   Glucose 111 (H) 65 - 99 mg/dL   BUN 6 6 - 24 mg/dL   Creatinine, Ser 0.86 0.76 - 1.27 mg/dL   GFR calc non Af Amer 104 >59 mL/min/1.73   GFR calc Af Amer 120 >59 mL/min/1.73   BUN/Creatinine Ratio 7 (L) 9 - 20   Sodium 141 134 - 144 mmol/L   Potassium 4.0 3.5 - 5.2 mmol/L   Chloride 101 97 - 108 mmol/L   CO2 22 18 - 29 mmol/L   Calcium 9.7 8.7 - 10.2 mg/dL   Total Protein 7.6 6.0 - 8.5 g/dL   Albumin 4.8 3.5 - 5.5 g/dL   Globulin, Total 2.8 1.5 - 4.5 g/dL   Albumin/Globulin Ratio 1.7 1.1 - 2.5   Bilirubin Total 1.0 0.0 - 1.2 mg/dL   Alkaline Phosphatase 99 39 - 117 IU/L   AST 16 0 - 40 IU/L   ALT 12 0 - 44 IU/L  TSH  Result Value Ref Range   TSH 1.340 0.450 - 4.500 uIU/mL      Assessment & Plan:   Problem List Items Addressed This Visit      Respiratory   Pharyngitis    Will treat with Rx mouthwash; not using adhesive for dentures, so not likely a reaction to adhesive; if symptoms persist, let me know; patient says erosion along buccal mucosa where he had tooth pulled, some dental work near by; if that does not resolve, let me know        Musculoskeletal and Integument   Internal derangement of right knee    Refer to orthopaedist; continue knee brace; no imaging at this time      Effusion of right knee joint - Primary    Refer to orthopaedist; continue knee brace; no imaging at this time      Relevant Orders   Ambulatory referral to Orthopedic Surgery     Other   Elevated systolic blood pressure    Dash guidelines; monitor; no medicine at this  time          Follow up plan: Return in about 3 months (around 05/25/2015) for Amy to check blood pressure; PRN to see Dr. Sanda Klein.  An after-visit summary was printed and given to the patient at [facility].  Please see the patient instructions which may contain other information and recommendations beyond what is mentioned above in the assessment and plan. Meds ordered this encounter  Medications  . magic mouthwash SOLN    Sig: Take 5 mLs by mouth 4 (four) times daily as needed for mouth pain.    Dispense:  280 mL    Refill:  2

## 2015-02-25 ENCOUNTER — Other Ambulatory Visit: Payer: Self-pay

## 2015-02-25 MED ORDER — TRAZODONE HCL 50 MG PO TABS: ORAL_TABLET | ORAL | Status: DC

## 2015-02-25 MED ORDER — GABAPENTIN 100 MG PO CAPS: 100.0000 mg | ORAL_CAPSULE | Freq: Three times a day (TID) | ORAL | Status: DC

## 2015-02-25 NOTE — Telephone Encounter (Signed)
Patient would like 90day supply on rx's

## 2015-02-25 NOTE — Assessment & Plan Note (Signed)
Refer to orthopaedist; continue knee brace; no imaging at this time

## 2015-02-25 NOTE — Assessment & Plan Note (Signed)
Will treat with Rx mouthwash; not using adhesive for dentures, so not likely a reaction to adhesive; if symptoms persist, let me know; patient says erosion along buccal mucosa where he had tooth pulled, some dental work near by; if that does not resolve, let me know

## 2015-06-17 ENCOUNTER — Ambulatory Visit: Payer: Medicare Other | Admitting: Family Medicine

## 2015-07-13 DIAGNOSIS — R55 Syncope and collapse: Secondary | ICD-10-CM | POA: Diagnosis not present

## 2015-08-01 ENCOUNTER — Ambulatory Visit: Payer: Medicare Other | Admitting: Family Medicine

## 2015-08-09 ENCOUNTER — Encounter: Payer: Self-pay | Admitting: Family Medicine

## 2015-08-09 ENCOUNTER — Ambulatory Visit (INDEPENDENT_AMBULATORY_CARE_PROVIDER_SITE_OTHER): Payer: Medicare Other | Admitting: Family Medicine

## 2015-08-09 VITALS — BP 153/87 | HR 64 | Temp 97.8°F | Wt 172.6 lb

## 2015-08-09 DIAGNOSIS — I10 Essential (primary) hypertension: Secondary | ICD-10-CM

## 2015-08-09 DIAGNOSIS — G8929 Other chronic pain: Secondary | ICD-10-CM

## 2015-08-09 DIAGNOSIS — M2391 Unspecified internal derangement of right knee: Secondary | ICD-10-CM

## 2015-08-09 DIAGNOSIS — R030 Elevated blood-pressure reading, without diagnosis of hypertension: Secondary | ICD-10-CM | POA: Diagnosis not present

## 2015-08-09 DIAGNOSIS — M545 Low back pain, unspecified: Secondary | ICD-10-CM

## 2015-08-09 DIAGNOSIS — M25551 Pain in right hip: Secondary | ICD-10-CM

## 2015-08-09 DIAGNOSIS — G4701 Insomnia due to medical condition: Secondary | ICD-10-CM

## 2015-08-09 DIAGNOSIS — Z8619 Personal history of other infectious and parasitic diseases: Secondary | ICD-10-CM | POA: Diagnosis not present

## 2015-08-09 DIAGNOSIS — IMO0001 Reserved for inherently not codable concepts without codable children: Secondary | ICD-10-CM

## 2015-08-09 MED ORDER — DICLOFENAC SODIUM 1 % TD GEL
2.0000 g | Freq: Four times a day (QID) | TRANSDERMAL | Status: AC | PRN
Start: 2015-08-09 — End: ?

## 2015-08-09 MED ORDER — TRAZODONE HCL 150 MG PO TABS: 150.0000 mg | ORAL_TABLET | Freq: Every day | ORAL | Status: DC

## 2015-08-09 NOTE — Patient Instructions (Addendum)
Increase the trazodone from 100 mg to 150 mg at bedtime for sleep Increase the Voltaren to 4 grams over the right knee Try turmeric as a natural anti-inflammatory (for pain and arthritis). It comes in capsules where you buy aspirin and fish oil, but also as a spice where you buy pepper and garlic powder. If you need something for aches or pains, try to use Tylenol (acetaminphen) instead of non-steroidals (which include Aleve, ibuprofen, Advil, Motrin, and naproxen); non-steroidals can cause long-term kidney damage Call me with an update of the knee and back pain in 3-4 weeks Our chart shows that you are overdue for a tetanus shot; if you would like that, please schedule a nurse visit and we'll be glad to update your vaccines  Insomnia Insomnia is a sleep disorder that makes it difficult to fall asleep or to stay asleep. Insomnia can cause tiredness (fatigue), low energy, difficulty concentrating, mood swings, and poor performance at work or school.  There are three different ways to classify insomnia:  Difficulty falling asleep.  Difficulty staying asleep.  Waking up too early in the morning. Any type of insomnia can be long-term (chronic) or short-term (acute). Both are common. Short-term insomnia usually lasts for three months or less. Chronic insomnia occurs at least three times a week for longer than three months. CAUSES  Insomnia may be caused by another condition, situation, or substance, such as:  Anxiety.  Certain medicines.  Gastroesophageal reflux disease (GERD) or other gastrointestinal conditions.  Asthma or other breathing conditions.  Restless legs syndrome, sleep apnea, or other sleep disorders.  Chronic pain.  Menopause. This may include hot flashes.  Stroke.  Abuse of alcohol, tobacco, or illegal drugs.  Depression.  Caffeine.   Neurological disorders, such as Alzheimer disease.  An overactive thyroid (hyperthyroidism). The cause of insomnia may not be  known. RISK FACTORS Risk factors for insomnia include:  Gender. Women are more commonly affected than men.  Age. Insomnia is more common as you get older.  Stress. This may involve your professional or personal life.  Income. Insomnia is more common in people with lower income.  Lack of exercise.   Irregular work schedule or night shifts.  Traveling between different time zones. SIGNS AND SYMPTOMS If you have insomnia, trouble falling asleep or trouble staying asleep is the main symptom. This may lead to other symptoms, such as:  Feeling fatigued.  Feeling nervous about going to sleep.  Not feeling rested in the morning.  Having trouble concentrating.  Feeling irritable, anxious, or depressed. TREATMENT  Treatment for insomnia depends on the cause. If your insomnia is caused by an underlying condition, treatment will focus on addressing the condition. Treatment may also include:   Medicines to help you sleep.  Counseling or therapy.  Lifestyle adjustments. HOME CARE INSTRUCTIONS   Take medicines only as directed by your health care provider.  Keep regular sleeping and waking hours. Avoid naps.  Keep a sleep diary to help you and your health care provider figure out what could be causing your insomnia. Include:   When you sleep.  When you wake up during the night.  How well you sleep.   How rested you feel the next day.  Any side effects of medicines you are taking.  What you eat and drink.   Make your bedroom a comfortable place where it is easy to fall asleep:  Put up shades or special blackout curtains to block light from outside.  Use a white noise machine  to block noise.  Keep the temperature cool.   Exercise regularly as directed by your health care provider. Avoid exercising right before bedtime.  Use relaxation techniques to manage stress. Ask your health care provider to suggest some techniques that may work well for you. These may  include:  Breathing exercises.  Routines to release muscle tension.  Visualizing peaceful scenes.  Cut back on alcohol, caffeinated beverages, and cigarettes, especially close to bedtime. These can disrupt your sleep.  Do not overeat or eat spicy foods right before bedtime. This can lead to digestive discomfort that can make it hard for you to sleep.  Limit screen use before bedtime. This includes:  Watching TV.  Using your smartphone, tablet, and computer.  Stick to a routine. This can help you fall asleep faster. Try to do a quiet activity, brush your teeth, and go to bed at the same time each night.  Get out of bed if you are still awake after 15 minutes of trying to sleep. Keep the lights down, but try reading or doing a quiet activity. When you feel sleepy, go back to bed.  Make sure that you drive carefully. Avoid driving if you feel very sleepy.  Keep all follow-up appointments as directed by your health care provider. This is important. SEEK MEDICAL CARE IF:   You are tired throughout the day or have trouble in your daily routine due to sleepiness.  You continue to have sleep problems or your sleep problems get worse. SEEK IMMEDIATE MEDICAL CARE IF:   You have serious thoughts about hurting yourself or someone else.   This information is not intended to replace advice given to you by your health care provider. Make sure you discuss any questions you have with your health care provider.   Document Released: 06/08/2000 Document Revised: 03/02/2015 Document Reviewed: 03/12/2014 Elsevier Interactive Patient Education [employer].

## 2015-08-09 NOTE — Assessment & Plan Note (Signed)
Increase trazodone 

## 2015-08-09 NOTE — Progress Notes (Signed)
BP 153/87 mmHg  Pulse 64  Temp(Src) 97.8 F (36.6 C)  Ht   Wt 172 lb 9.6 oz (78.291 kg)  SpO2 99%   Subjective:    Patient ID: Timothy Mcknight, male    DOB: [birth year], 45 y.o.   MRN: [id number]  HPI: Timothy Mcknight is a 45 y.o. male  Chief Complaint  Patient presents with  . Follow-up  . Effusion of right knee    Cold weather is increasing pain patient states.   . Insomnia    Sleep is a little better. Maybe need an increase on medication. Maybe sleep 3 hours all together.    Having pain in the right knee, ongoing issue at least 10 years He is using voltaren gel for pain; he asked if that can be increased He is having pain in the back; wonders if the back is from the cane; near the bone; right in the middle Constant throbbing pain; worse with moving, "feels like ruptures"; no recent xrays Back pain for year and getting worse No known family hx of back problems No loss of control B/B Does have some hip pain on the right hip; just pain, no grinding or crunching; throbbing He has the brace on the right knee; pain persists He tried something and hit the knee wrong and it swelled a little; bangs it occasionally and hurts badly; getting out of the car is hard  Insomnia; medicine helps, but only about 3 hours No caffeine; trying to practice good sleep hygiene  Relevant past medical, surgical, family and social history reviewed and updated as indicated Diabetes in the family; mother has diabetes  Interim medical history since our last visit reviewed. Allergies and medications reviewed and updated.  Review of Systems Per HPI unless specifically indicated above     Objective:    BP 153/87 mmHg  Pulse 64  Temp(Src) 97.8 F (36.6 C)  Ht   Wt 172 lb 9.6 oz (78.291 kg)  SpO2 99%  Wt Readings from Last 3 Encounters:  08/09/15 172 lb 9.6 oz (78.291 kg)  02/23/15 178 lb (80.74 kg)  02/16/15 175 lb (79.379 kg)  patient weighed with brace and cane and shoes on  Physical  Exam  Constitutional: He appears well-developed and well-nourished. No distress (but appears uncomfortable today).  HENT:  Head: Normocephalic and atraumatic.  Right Ear: External ear normal.  Eyes: EOM are normal. No scleral icterus.  Neck: No thyromegaly present.  Cardiovascular: Normal rate and regular rhythm.   Pulmonary/Chest: Effort normal and breath sounds normal.  Abdominal: He exhibits no distension.  Musculoskeletal:       Right hip: He exhibits tenderness (tenderness with external rotation and abduction).       Right knee: He exhibits decreased range of motion (wearing brace).       Lumbar back: He exhibits decreased range of motion. He exhibits no swelling, no edema, no deformity and no spasm.  Neurological: He displays no tremor. Gait abnormal.  Skin: No rash noted.  Psychiatric: He has a normal mood and affect.   Results for orders placed or performed in visit on [id number]  Helicobacter Pylori Antibody, IGM  Result Value Ref Range   H. pylori, IgM Abs 11.2 (H) 0.0 - 8.9 units  H. pylori antibody, IgG  Result Value Ref Range   H Pylori IgG <0.9 0.0 - 0.8 U/mL  H. pylori antibody, IgA  Result Value Ref Range   H. pylori, IgA Abs <9.0 0.0 - 8.9 units  CBC with Differential/Platelet  Result Value Ref Range   WBC 8.2 3.4 - 10.8 x10E3/uL   RBC 5.08 4.14 - 5.80 x10E6/uL   Hemoglobin 15.8 12.6 - 17.7 g/dL   Hematocrit 45.4 37.5 - 51.0 %   MCV 89 79 - 97 fL   MCH 31.1 26.6 - 33.0 pg   MCHC 34.8 31.5 - 35.7 g/dL   RDW 14.7 12.3 - 15.4 %   Platelets 222 150 - 379 x10E3/uL   Neutrophils 71 %   Lymphs 22 %   Monocytes 6 %   Eos 1 %   Basos 0 %   Neutrophils Absolute 5.8 1.4 - 7.0 x10E3/uL   Lymphocytes Absolute 1.8 0.7 - 3.1 x10E3/uL   Monocytes Absolute 0.5 0.1 - 0.9 x10E3/uL   EOS (ABSOLUTE) 0.0 0.0 - 0.4 x10E3/uL   Basophils Absolute 0.0 0.0 - 0.2 x10E3/uL   Immature Granulocytes 0 %   Immature Grans (Abs) 0.0 0.0 - 0.1 x10E3/uL  Comprehensive metabolic panel   Result Value Ref Range   Glucose 111 (H) 65 - 99 mg/dL   BUN 6 6 - 24 mg/dL   Creatinine, Ser 0.86 0.76 - 1.27 mg/dL   GFR calc non Af Amer 104 >59 mL/min/1.73   GFR calc Af Amer 120 >59 mL/min/1.73   BUN/Creatinine Ratio 7 (L) 9 - 20   Sodium 141 134 - 144 mmol/L   Potassium 4.0 3.5 - 5.2 mmol/L   Chloride 101 97 - 108 mmol/L   CO2 22 18 - 29 mmol/L   Calcium 9.7 8.7 - 10.2 mg/dL   Total Protein 7.6 6.0 - 8.5 g/dL   Albumin 4.8 3.5 - 5.5 g/dL   Globulin, Total 2.8 1.5 - 4.5 g/dL   Albumin/Globulin Ratio 1.7 1.1 - 2.5   Bilirubin Total 1.0 0.0 - 1.2 mg/dL   Alkaline Phosphatase 99 39 - 117 IU/L   AST 16 0 - 40 IU/L   ALT 12 0 - 44 IU/L  TSH  Result Value Ref Range   TSH 1.340 0.450 - 4.500 uIU/mL      Assessment & Plan:   Problem List Items Addressed This Visit      Digestive   Hx of Helicobacter infection    Summer 2016; treated, seen by GI        Musculoskeletal and Integument   Internal derangement of right knee    Chronic; continue brace; increase voltaren to 4 grams at a time if needed; limiting oral NSAIDs b/c history of GI issues, but did caution patient about some systemic effect possible, reasons to stop and call me        Other   Insomnia secondary to chronic pain    Increase trazodone      Relevant Medications   traZODone (DESYREL) 150 MG tablet   Elevated systolic blood pressure    I thought this was rechecked, but I don't see that after he has already left; will ask staff to contact him and ask him to monitor BP here in a couple of weeks; DASH guidelines      Low back pain - Primary    No red flags; will get xray to evaluate for anterolisthesis or significant DDD or arthritis; there may have been some compensation over the years due to right knee pain and limitation      Relevant Orders   DG Lumbar Spine Complete   Chronic right hip pain    May be result of years of compensation from right knee  injury; will get xray to r/o significant  arthritis      Relevant Orders   DG HIP UNILAT WITH PELVIS 2-3 VIEWS RIGHT      Follow up plan: Return when due, for complete physical.  An after-visit summary was printed and given to the patient at [facility].  Please see the patient instructions which may contain other information and recommendations beyond what is mentioned above in the assessment and plan.  Meds ordered this encounter  Medications  . traZODone (DESYREL) 150 MG tablet    Sig: Take 1 tablet (150 mg total) by mouth at bedtime.    Dispense:  30 tablet    Refill:  5    We're increasing the dose; new strength and new sig  . diclofenac sodium (VOLTAREN) 1 % GEL    Sig: Apply 2 g topically 4 (four) times daily as needed (4 grams over knee).    Dispense:  100 g    Refill:  5

## 2015-08-10 ENCOUNTER — Telehealth: Payer: Self-pay | Admitting: Family Medicine

## 2015-08-10 NOTE — Assessment & Plan Note (Signed)
May be result of years of compensation from right knee injury; will get xray to r/o significant arthritis

## 2015-08-10 NOTE — Assessment & Plan Note (Signed)
No red flags; will get xray to evaluate for anterolisthesis or significant DDD or arthritis; there may have been some compensation over the years due to right knee pain and limitation

## 2015-08-10 NOTE — Telephone Encounter (Signed)
Patient's blood pressure was elevated at our office yesterday Please ask him to schedule a CMA visit just for BP recheck; also, he appears due for a tetanus booster; offer that and if he's interested, he could get that at the same visit Encourage him to try DASH guidelines, mail him hand-out, encourage less salt, less red meat, more fruits and veggies Thank you

## 2015-08-10 NOTE — Telephone Encounter (Signed)
Spoke with patient and he said he will call back and schedule an appt with CMA for bp check and possible tetanus booster.

## 2015-08-10 NOTE — Assessment & Plan Note (Signed)
Chronic; continue brace; increase voltaren to 4 grams at a time if needed; limiting oral NSAIDs b/c history of GI issues, but did caution patient about some systemic effect possible, reasons to stop and call me

## 2015-08-10 NOTE — Assessment & Plan Note (Signed)
Summer 2016; treated, seen by GI

## 2015-08-10 NOTE — Assessment & Plan Note (Signed)
I thought this was rechecked, but I don't see that after he has already left; will ask staff to contact him and ask him to monitor BP here in a couple of weeks; DASH guidelines

## 2015-08-13 NOTE — Telephone Encounter (Signed)
Please encouraged DASH guidelines, mail hand-out

## 2015-08-15 NOTE — Telephone Encounter (Signed)
Printed thank you

## 2015-08-15 NOTE — Telephone Encounter (Signed)
Dr.Lada,  I am unable to print the DASH guideline, if you print it I will be happy to mail it to the patient.

## 2015-08-30 ENCOUNTER — Ambulatory Visit (INDEPENDENT_AMBULATORY_CARE_PROVIDER_SITE_OTHER): Payer: Medicare Other

## 2015-08-30 VITALS — BP 118/74

## 2015-08-30 DIAGNOSIS — Z136 Encounter for screening for cardiovascular disorders: Secondary | ICD-10-CM | POA: Diagnosis not present

## 2015-08-30 DIAGNOSIS — Z0130 Encounter for examination of blood pressure without abnormal findings: Secondary | ICD-10-CM

## 2015-08-30 DIAGNOSIS — Z23 Encounter for immunization: Secondary | ICD-10-CM | POA: Diagnosis not present

## 2015-09-03 ENCOUNTER — Other Ambulatory Visit: Payer: Self-pay | Admitting: Family Medicine

## 2015-09-05 NOTE — Telephone Encounter (Signed)
New rx sent for the gabapentin I increased his trazodone in February, so I denied the lower strength Rx request Please make sure pharmacy got the Rx in Feb for 150 mg strength Thank you

## 2015-09-05 NOTE — Telephone Encounter (Signed)
Spoke with pharmacy, they did get the higher dose of the trazodone.

## 2015-09-21 ENCOUNTER — Telehealth: Payer: Self-pay | Admitting: Family Medicine

## 2015-09-21 NOTE — Telephone Encounter (Signed)
I ordered xrays on this patient about 6 weeks ago; please check on status, call pt if needed; thank you

## 2015-09-21 NOTE — Telephone Encounter (Signed)
Left message to call.

## 2015-09-22 NOTE — Telephone Encounter (Signed)
Left message to call.

## 2015-09-23 NOTE — Telephone Encounter (Signed)
Patient notified, he has not went to get them done yet. He states he will go sometime next week.

## 2015-10-03 ENCOUNTER — Ambulatory Visit
Admission: RE | Admit: 2015-10-03 | Discharge: 2015-10-03 | Disposition: A | Discharge status: Home / Self Care | Payer: Medicare Other | Source: Ambulatory Visit | Attending: Family Medicine | Admitting: Family Medicine

## 2015-10-03 DIAGNOSIS — M25551 Pain in right hip: Secondary | ICD-10-CM | POA: Insufficient documentation

## 2015-10-03 DIAGNOSIS — G8929 Other chronic pain: Secondary | ICD-10-CM | POA: Diagnosis not present

## 2015-10-03 DIAGNOSIS — M545 Low back pain: Secondary | ICD-10-CM | POA: Diagnosis not present

## 2015-10-10 ENCOUNTER — Telehealth: Payer: Self-pay

## 2015-10-10 MED ORDER — TRAZODONE HCL 150 MG PO TABS: 150.0000 mg | ORAL_TABLET | Freq: Every day | ORAL | Status: AC

## 2015-10-10 NOTE — Telephone Encounter (Signed)
rx sent

## 2015-10-10 NOTE — Telephone Encounter (Signed)
Pharmacy would like approval to change Trazodone rx to a 90 day supply. He is following you to [facility].

## 2016-02-08 ENCOUNTER — Encounter: Payer: Medicare Other | Admitting: Family Medicine

## 2021-04-19 ENCOUNTER — Emergency Department: Payer: Medicare Other

## 2021-04-19 ENCOUNTER — Other Ambulatory Visit: Payer: Self-pay

## 2021-04-19 ENCOUNTER — Emergency Department
Admission: EM | Admit: 2021-04-19 | Discharge: 2021-04-19 | Disposition: A | Discharge status: Home / Self Care | Payer: Medicare Other | Attending: Emergency Medicine | Admitting: Emergency Medicine

## 2021-04-19 DIAGNOSIS — Z79899 Other long term (current) drug therapy: Secondary | ICD-10-CM | POA: Insufficient documentation

## 2021-04-19 DIAGNOSIS — Y900 Blood alcohol level of less than 20 mg/100 ml: Secondary | ICD-10-CM | POA: Insufficient documentation

## 2021-04-19 DIAGNOSIS — F17210 Nicotine dependence, cigarettes, uncomplicated: Secondary | ICD-10-CM | POA: Insufficient documentation

## 2021-04-19 DIAGNOSIS — D72829 Elevated white blood cell count, unspecified: Secondary | ICD-10-CM | POA: Insufficient documentation

## 2021-04-19 DIAGNOSIS — F1410 Cocaine abuse, uncomplicated: Secondary | ICD-10-CM | POA: Insufficient documentation

## 2021-04-19 DIAGNOSIS — R55 Syncope and collapse: Secondary | ICD-10-CM | POA: Insufficient documentation

## 2021-04-19 DIAGNOSIS — E860 Dehydration: Secondary | ICD-10-CM | POA: Insufficient documentation

## 2021-04-19 DIAGNOSIS — Z85038 Personal history of other malignant neoplasm of large intestine: Secondary | ICD-10-CM | POA: Insufficient documentation

## 2021-04-19 DIAGNOSIS — K0401 Reversible pulpitis: Secondary | ICD-10-CM | POA: Insufficient documentation

## 2021-04-19 DIAGNOSIS — Z20822 Contact with and (suspected) exposure to covid-19: Secondary | ICD-10-CM | POA: Insufficient documentation

## 2021-04-19 LAB — URINE DRUG SCREEN, QUALITATIVE (ARMC ONLY)
Amphetamines, Ur Screen: NOT DETECTED
Barbiturates, Ur Screen: NOT DETECTED
Benzodiazepine, Ur Scrn: NOT DETECTED
Cannabinoid 50 Ng, Ur ~~LOC~~: POSITIVE — AB
Cocaine Metabolite,Ur ~~LOC~~: POSITIVE — AB
MDMA (Ecstasy)Ur Screen: NOT DETECTED
Methadone Scn, Ur: NOT DETECTED
Opiate, Ur Screen: NOT DETECTED
Phencyclidine (PCP) Ur S: NOT DETECTED
Tricyclic, Ur Screen: NOT DETECTED

## 2021-04-19 LAB — CBC WITH DIFFERENTIAL/PLATELET
Abs Immature Granulocytes: 0.07 10*3/uL (ref 0.00–0.07)
Basophils Absolute: 0 10*3/uL (ref 0.0–0.1)
Basophils Relative: 0 %
Eosinophils Absolute: 0 10*3/uL (ref 0.0–0.5)
Eosinophils Relative: 0 %
HCT: 38 % — ABNORMAL LOW (ref 39.0–52.0)
Hemoglobin: 12.6 g/dL — ABNORMAL LOW (ref 13.0–17.0)
Immature Granulocytes: 1 %
Lymphocytes Relative: 10 %
Lymphs Abs: 1.3 10*3/uL (ref 0.7–4.0)
MCH: 31.9 pg (ref 26.0–34.0)
MCHC: 33.2 g/dL (ref 30.0–36.0)
MCV: 96.2 fL (ref 80.0–100.0)
Monocytes Absolute: 0.7 10*3/uL (ref 0.1–1.0)
Monocytes Relative: 6 %
Neutro Abs: 10.2 10*3/uL — ABNORMAL HIGH (ref 1.7–7.7)
Neutrophils Relative %: 83 %
Platelets: 157 10*3/uL (ref 150–400)
RBC: 3.95 MIL/uL — ABNORMAL LOW (ref 4.22–5.81)
RDW: 14.3 % (ref 11.5–15.5)
WBC: 12.3 10*3/uL — ABNORMAL HIGH (ref 4.0–10.5)
nRBC: 0 % (ref 0.0–0.2)

## 2021-04-19 LAB — COMPREHENSIVE METABOLIC PANEL
ALT: 15 U/L (ref 0–44)
AST: 49 U/L — ABNORMAL HIGH (ref 15–41)
Albumin: 3.2 g/dL — ABNORMAL LOW (ref 3.5–5.0)
Alkaline Phosphatase: 57 U/L (ref 38–126)
Anion gap: 6 (ref 5–15)
BUN: 13 mg/dL (ref 6–20)
CO2: 24 mmol/L (ref 22–32)
Calcium: 7.8 mg/dL — ABNORMAL LOW (ref 8.9–10.3)
Chloride: 111 mmol/L (ref 98–111)
Creatinine, Ser: 0.82 mg/dL (ref 0.61–1.24)
GFR, Estimated: >60 mL/min (ref >60)
Glucose, Bld: 110 mg/dL — ABNORMAL HIGH (ref 70–99)
Potassium: 3.7 mmol/L (ref 3.5–5.1)
Sodium: 141 mmol/L (ref 135–145)
Total Bilirubin: 0.9 mg/dL (ref 0.3–1.2)
Total Protein: 5.7 g/dL — ABNORMAL LOW (ref 6.5–8.1)

## 2021-04-19 LAB — URINALYSIS, ROUTINE W REFLEX MICROSCOPIC
Bilirubin Urine: NEGATIVE
Glucose, UA: NEGATIVE mg/dL
Ketones, ur: NEGATIVE mg/dL
Leukocytes,Ua: NEGATIVE
Nitrite: NEGATIVE
Protein, ur: NEGATIVE mg/dL
Specific Gravity, Urine: 1.008 (ref 1.005–1.030)
Squamous Epithelial / HPF: NONE SEEN (ref 0–5)
pH: 7 (ref 5.0–8.0)

## 2021-04-19 LAB — RESP PANEL BY RT-PCR (FLU A&B, COVID) ARPGX2
Influenza A by PCR: NEGATIVE
Influenza B by PCR: NEGATIVE
SARS Coronavirus 2 by RT PCR: NEGATIVE

## 2021-04-19 LAB — TROPONIN I (HIGH SENSITIVITY)
Troponin I (High Sensitivity): 27 ng/L — ABNORMAL HIGH (ref <18)
Troponin I (High Sensitivity): 25 ng/L — ABNORMAL HIGH (ref <18)

## 2021-04-19 LAB — CK
Total CK: 2348 U/L — ABNORMAL HIGH (ref 49–397)
Total CK: 2180 U/L — ABNORMAL HIGH (ref 49–397)

## 2021-04-19 LAB — ETHANOL: Alcohol, Ethyl (B): <10 mg/dL (ref <10)

## 2021-04-19 MED ORDER — IOHEXOL 350 MG/ML SOLN
75.0000 mL | Freq: Once | INTRAVENOUS | Status: AC | PRN
  Administered 2021-04-19: 75 mL via INTRAVENOUS
  Filled 2021-04-19: qty 75

## 2021-04-19 MED ORDER — SODIUM CHLORIDE 0.9 % IV BOLUS
1000.0000 mL | Freq: Once | INTRAVENOUS | Status: AC
  Administered 2021-04-19: 1000 mL via INTRAVENOUS

## 2021-04-19 MED ORDER — AMOXICILLIN-POT CLAVULANATE 875-125 MG PO TABS: 1.0000 | ORAL_TABLET | Freq: Two times a day (BID) | ORAL | 0 refills | Status: AC
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
## 2021-04-19 NOTE — Discharge Instructions (Addendum)
I suspect that your passing out was from dehydration secondary to not eating, donating plasma and the drug abuse.  I would recommend that you do not use cocaine/THC.  We are starting you on some antibiotics for possible teeth infection.  Return to the ER for fevers, worsening pain or any other concerns

## 2021-04-19 NOTE — ED Provider Notes (Signed)
[facility] Emergency Department Provider Note  ____________________________________________   Event Date/Time   First MD Initiated Contact with Patient 04/19/21 1508     (approximate)  I have reviewed the triage vital signs and the nursing notes.   HISTORY  Chief Complaint Syncope   HPI Timothy Mcknight is a 50 y.o. male with prior H. pylori, hypertension who comes in with concerns for syncopal episode.  Patient reportedly gave plasma this morning.  Had not eaten or drank anything yet today.  States that when he was on the bus or someone smoking near him and he started feeling really hot all over and queasy in his stomach.  He states that he got off the bus to try to get away from the smoke and that when he got off he started to feel more lightheaded.  Patient had 2 witnessed syncopal episodes with reportedly hitting his head.  Blood pressure was in the 03K systolic with EMS and patient was already given 1 L of fluids prior to arrival with blood pressure of 105/76.  Patient denies any chest pain, shortness of breath, abdominal pain, leg swelling or any other concerns.  Patient feels better with the fluids, unclear what brought on, occurred just prior to arrival.  Sugar and EKG were normal with EMS.  Patient denies any alcohol or drugs       Past Medical History:  Diagnosis Date   Arthritis    knees   Neuromuscular disorder ([place])    NUMBNESS IN RIGHT FOOT    Patient Active Problem List   Diagnosis Date Noted   Low back pain 08/09/2015   Chronic right hip pain 08/09/2015   Effusion of right knee joint 02/23/2015   Special screening for malignant neoplasms, colon    Benign neoplasm of sigmoid colon    Rectal polyp    Restless leg syndrome 02/02/2015   Snoring 02/02/2015   Myoclonus 02/02/2015   Poor sleep pattern 74/25/9563   Hx of Helicobacter infection 02/02/2015   Internal derangement of right knee 12/29/2014   Insomnia secondary to chronic  pain 12/29/2014   Abnormal stools 12/29/2014   Elevated systolic blood pressure 87/56/4332   Arthritis     Past Surgical History:  Procedure Laterality Date   COLONOSCOPY WITH PROPOFOL N/A 02/16/2015   Procedure: COLONOSCOPY WITH PROPOFOL;  Surgeon: Lucilla Lame, MD;  Location: [place];  Service: Endoscopy;  Laterality: N/A;   ESOPHAGOGASTRODUODENOSCOPY (EGD) WITH PROPOFOL N/A 02/16/2015   Procedure: ESOPHAGOGASTRODUODENOSCOPY (EGD) WITH PROPOFOL;  Surgeon: Lucilla Lame, MD;  Location: [place];  Service: Endoscopy;  Laterality: N/A;   KNEE SURGERY Right 2007   KNEE SURGERY Right 2011   graph, pins, bolts   KNEE SURGERY Right 2012   POLYPECTOMY  02/16/2015   Procedure: POLYPECTOMY;  Surgeon: Lucilla Lame, MD;  Location: [place];  Service: Endoscopy;;  sigmoid polyp, rectal polyp, duodenum biopsy, gastric biopsy    Prior to Admission medications   Medication Sig Start Date End Date Taking? Authorizing Provider  diclofenac sodium (VOLTAREN) 1 % GEL Apply 2 g topically 4 (four) times daily as needed (4 grams over knee). 08/09/15   Lada, Satira Anis, MD  gabapentin (NEURONTIN) 100 MG capsule TAKE 1 CAPSULE (100 MG TOTAL) BY MOUTH 3 (THREE) TIMES DAILY. 09/05/15   Arnetha Courser, MD  magic mouthwash SOLN Take 5 mLs by mouth 4 (four) times daily as needed for mouth pain. 02/23/15   Arnetha Courser, MD  omeprazole (  PRILOSEC) 40 MG capsule Take 1 capsule (40 mg total) by mouth daily. 01/03/15   Arnetha Courser, MD  traZODone (DESYREL) 150 MG tablet Take 1 tablet (150 mg total) by mouth at bedtime. 10/10/15   Arnetha Courser, MD    Allergies Patient has no known allergies.  Family History  Problem Relation Age of Onset   Diabetes Mother    Hypertension Mother    Heart disease Father     Social History Social History   Tobacco Use   Smoking status: Every Day    Packs/day: 0.50    Years: 20.00    Pack years: 10.00    Types: Cigarettes   Smokeless tobacco:  Never  Substance Use Topics   Alcohol use: No    Comment: occasionally   Drug use: No    Types: Marijuana    Comment: USED FOR SLEEP/ NONE FOR 2 MONTHS      Review of Systems Constitutional: No fever/chills syncope, weakness Eyes: No visual changes. ENT: No sore throat. Cardiovascular: Denies chest pain. Respiratory: Denies shortness of breath. Gastrointestinal: No abdominal pain.  Little queasy prior to syncopal episode no diarrhea.  No constipation. Genitourinary: Negative for dysuria. Musculoskeletal: Negative for back pain. Skin: Negative for rash. Neurological: Negative for headaches, focal weakness or numbness. All other ROS negative ____________________________________________   PHYSICAL EXAM:  VITAL SIGNS: Blood pressure 105/76, pulse 64, temperature 98.7 F (37.1 C), temperature source Oral, resp. rate 14, height 6\' 3"  (1.905 m), weight 73.9 kg, SpO2 95 %.   Constitutional: Alert and oriented. Well appearing and in no acute distress appears tired Eyes: Conjunctivae are normal. EOMI. Head: Atraumatic. Nose: No congestion/rhinnorhea. Mouth/Throat: Mucous membranes are moist.   Neck: No stridor. Trachea Midline. FROM Cardiovascular: Normal rate, regular rhythm. Grossly normal heart sounds.  Good peripheral circulation. Respiratory: Normal respiratory effort.  No retractions. Lungs CTAB.  No chest wall tenderness Gastrointestinal: Soft and nontender. No distention. No abdominal bruits.  Musculoskeletal: No lower extremity tenderness nor edema.  No joint effusions. Neurologic:  Normal speech and language. No gross focal neurologic deficits are appreciated.  Skin:  Skin is warm, dry and intact. No rash noted. Psychiatric: Mood and affect are normal. Speech and behavior are normal. GU: Deferred   ____________________________________________   LABS (all labs ordered are listed, but only abnormal results are displayed)  Labs Reviewed  CBC WITH  DIFFERENTIAL/PLATELET - Abnormal; Notable for the following components:      Result Value   WBC 12.3 (*)    RBC 3.95 (*)    Hemoglobin 12.6 (*)    HCT 38.0 (*)    Neutro Abs 10.2 (*)    All other components within normal limits  COMPREHENSIVE METABOLIC PANEL - Abnormal; Notable for the following components:   Glucose, Bld 110 (*)    Calcium 7.8 (*)    Total Protein 5.7 (*)    Albumin 3.2 (*)    AST 49 (*)    All other components within normal limits  URINALYSIS, ROUTINE W REFLEX MICROSCOPIC - Abnormal; Notable for the following components:   Color, Urine STRAW (*)    APPearance CLEAR (*)    Hgb urine dipstick SMALL (*)    Bacteria, UA RARE (*)    All other components within normal limits  URINE DRUG SCREEN, QUALITATIVE ([facility] ONLY) - Abnormal; Notable for the following components:   Cocaine Metabolite,Ur [place] POSITIVE (*)    Cannabinoid 50 Ng, Ur [place] POSITIVE (*)    All other  components within normal limits  CK - Abnormal; Notable for the following components:   Total CK 2,348 (*)    All other components within normal limits  TROPONIN I (HIGH SENSITIVITY) - Abnormal; Notable for the following components:   Troponin I (High Sensitivity) 27 (*)    All other components within normal limits  TROPONIN I (HIGH SENSITIVITY) - Abnormal; Notable for the following components:   Troponin I (High Sensitivity) 25 (*)    All other components within normal limits  RESP PANEL BY RT-PCR (FLU A&B, COVID) ARPGX2  ETHANOL  CK   ____________________________________________   ED ECG REPORT I, Vanessa [place], the attending physician, personally viewed and interpreted this ECG.  Normal sinus rate of 77, no ST elevation, T wave version in V2 normal intervals ____________________________________________  RADIOLOGY   Official radiology report(s): CT HEAD WO CONTRAST (5MM)  Result Date: 04/19/2021 CLINICAL DATA:  Pt had syncopal episode on bus after giving plasma. Witnesses stated pt hit head 2  times. EMS BP 58K systolic on scene pt bp 998/33 on arrival. EXAM: CT HEAD WITHOUT CONTRAST CT CERVICAL SPINE WITHOUT CONTRAST TECHNIQUE: Multidetector CT imaging of the head and cervical spine was performed following the standard protocol without intravenous contrast. Multiplanar CT image reconstructions of the cervical spine were also generated. COMPARISON:  None. FINDINGS: CT HEAD FINDINGS Brain: No evidence of large-territorial acute infarction. No parenchymal hemorrhage. No mass lesion. No extra-axial collection. No mass effect or midline shift. No hydrocephalus. Basilar cisterns are patent. Vascular: No hyperdense vessel. Skull: No acute fracture or focal lesion. Sinuses/Orbits: Paranasal sinuses and mastoid air cells are clear. The orbits are unremarkable. Other: None. CT CERVICAL SPINE FINDINGS Alignment: Reversal of the normal cervical lordosis likely due to positioning. Skull base and vertebrae: Mild multilevel degenerative changes of the spine. No acute fracture. No aggressive appearing focal osseous lesion or focal pathologic process. Soft tissues and spinal canal: No prevertebral fluid or swelling. No visible canal hematoma. Upper chest: Paraseptal emphysematous changes. Other: None. IMPRESSION: 1. No acute intracranial abnormality. 2. No acute displaced fracture or traumatic listhesis of the cervical spine. 3.  Emphysema (ICD10-J43.9). Electronically Signed   By: Iven Finn M.D.   On: 04/19/2021 15:46   CT Angio Chest PE W and/or Wo Contrast  Result Date: 04/19/2021 CLINICAL DATA:  Syncopal episode EXAM: CT ANGIOGRAPHY CHEST WITH CONTRAST TECHNIQUE: Multidetector CT imaging of the chest was performed using the standard protocol during bolus administration of intravenous contrast. Multiplanar CT image reconstructions and MIPs were obtained to evaluate the vascular anatomy. CONTRAST:  42mL OMNIPAQUE IOHEXOL 350 MG/ML SOLN COMPARISON:  Chest x-ray 04/19/2021 FINDINGS: Cardiovascular:  Satisfactory opacification of the pulmonary arteries to the segmental level. No evidence of pulmonary embolism. Normal heart size. No pericardial effusion. Mild coronary calcification. Mediastinum/Nodes: No enlarged mediastinal, nodes. Mild right hilar nodes. Thyroid gland, trachea, and esophagus demonstrate no significant findings. Lungs/Pleura: Lungs are clear. No pleural effusion or pneumothorax. Punctate calcified granuloma in the right upper lobe. Upper Abdomen: No acute abnormality. Subcentimeter hypodensity in the right hepatic lobe too small to further characterize. Musculoskeletal: No chest wall abnormality. No acute or significant osseous findings. Review of the MIP images confirms the above findings. IMPRESSION: 1. No CT evidence for acute pulmonary embolus or aortic dissection. 2. Clear lung fields Electronically Signed   By: Donavan Foil M.D.   On: 04/19/2021 18:34   CT Cervical Spine Wo Contrast  Result Date: 04/19/2021 CLINICAL DATA:  Pt had syncopal episode on bus  after giving plasma. Witnesses stated pt hit head 2 times. EMS BP 10C systolic on scene pt bp 585/27 on arrival. EXAM: CT HEAD WITHOUT CONTRAST CT CERVICAL SPINE WITHOUT CONTRAST TECHNIQUE: Multidetector CT imaging of the head and cervical spine was performed following the standard protocol without intravenous contrast. Multiplanar CT image reconstructions of the cervical spine were also generated. COMPARISON:  None. FINDINGS: CT HEAD FINDINGS Brain: No evidence of large-territorial acute infarction. No parenchymal hemorrhage. No mass lesion. No extra-axial collection. No mass effect or midline shift. No hydrocephalus. Basilar cisterns are patent. Vascular: No hyperdense vessel. Skull: No acute fracture or focal lesion. Sinuses/Orbits: Paranasal sinuses and mastoid air cells are clear. The orbits are unremarkable. Other: None. CT CERVICAL SPINE FINDINGS Alignment: Reversal of the normal cervical lordosis likely due to positioning.  Skull base and vertebrae: Mild multilevel degenerative changes of the spine. No acute fracture. No aggressive appearing focal osseous lesion or focal pathologic process. Soft tissues and spinal canal: No prevertebral fluid or swelling. No visible canal hematoma. Upper chest: Paraseptal emphysematous changes. Other: None. IMPRESSION: 1. No acute intracranial abnormality. 2. No acute displaced fracture or traumatic listhesis of the cervical spine. 3.  Emphysema (ICD10-J43.9). Electronically Signed   By: Iven Finn M.D.   On: 04/19/2021 15:46   DG Chest Portable 1 View  Result Date: 04/19/2021 CLINICAL DATA:  sob EXAM: PORTABLE CHEST 1 VIEW COMPARISON:  None. FINDINGS: The cardiomediastinal silhouette is within normal limits. No pleural effusion. No pneumothorax. No mass or consolidation. No acute osseous abnormality. IMPRESSION: No acute findings in the chest. Electronically Signed   By: Albin Felling M.D.   On: 04/19/2021 16:03    ____________________________________________   PROCEDURES  Procedure(s) performed (including Critical Care):  Procedures   ____________________________________________   INITIAL IMPRESSION / ASSESSMENT AND PLAN / ED COURSE  Timothy Mcknight was evaluated in Emergency Department on 04/19/2021 for the symptoms described in the history of present illness. He was evaluated in the context of the global COVID-19 pandemic, which necessitated consideration that the patient might be at risk for infection with the SARS-CoV-2 virus that causes COVID-19. Institutional protocols and algorithms that pertain to the evaluation of patients at risk for COVID-19 are in a state of rapid change based on information released by regulatory bodies including the CDC and federal and state organizations. These policies and algorithms were followed during the patient's care in the ED.    Patient comes in with concern for syncopal episode.  Suspect dehydration given not eating and giving  plasma.  Patient will be given another liter of fluid and labs will be checked to evaluate for Electra abnormalities, AKI.  We will get EKG and cardiac marker to evaluate for any arrhythmia.  No shortness of breath and lower suspicion for PE and no abdominal pain or back pain to suggest AAA rupture.  Patient's blood pressures are responding with fluids.  We will continue to closely monitor.  5:30 PM reevaluated patient he states that he is feeling better.  Patient had reportedly desatted down to 85% per nurse but unclear if it was a good waveform or not.  I have taken patient back off the oxygen.  We will get a CT PE to make sure evidence of pulmonary embolism abdominal pain to suggest acute abdominal process.  Given patient does have a little bit of an odd affect I did add on an ethanol and urine drug screen just to see if that could be playing a role  Patient  CK was elevated but it was downtrending and his troponin was slightly elevated but also downtrending.  He is cocaine and THC positive.  I suspect that this plus not eating and donating plasma has contributed to his syncopal episodes.  His CT PE is negative for pulmonary embolism and he has not been requiring any oxygen I suspect that the earlier reading was just an error.  We will get patient up and ambulate him.  Abdominal exam remains soft and nontender.  Patient that he last used cocaine last night which I also suspect is causing some of his dehydration and CK, troponin elevation but these are both downtrending and he denies any chest pain.   Patient has been ambulatory to the bathroom without any assistance.  Patient is tolerating eating at bedside.  Discussed his results and patient feels comfortable being discharged home.  Patient reports that he is homeless and I will see there is any resources to give him for homeless shelters.    Patient's white count was slightly elevated with a little bit of a left shift he is reporting some poor dentition  and teeth pain.  Will cover with course of Augmentin due to concern for pulpitis and patient was instructed to follow-up with a dentist for this.          ____________________________________________   FINAL CLINICAL IMPRESSION(S) / ED DIAGNOSES   Final diagnoses:  Dehydration  Syncope and collapse  Cocaine abuse ([place])  Pulpitis      MEDICATIONS GIVEN DURING THIS VISIT:  Medications  sodium chloride 0.9 % bolus 1,000 mL (0 mLs Intravenous Stopped 04/19/21 1757)  iohexol (OMNIPAQUE) 350 MG/ML injection 75 mL (75 mLs Intravenous Contrast Given 04/19/21 1754)     ED Discharge Orders          Ordered    amoxicillin-clavulanate (AUGMENTIN) 875-125 MG tablet  2 times daily        04/19/21 1931             Note:  This document was prepared using Dragon voice recognition software and may include unintentional dictation errors.    Vanessa , MD 04/19/21 [phone]

## 2023-02-14 IMAGING — CT CT CERVICAL SPINE W/O CM
3 of 4 series · 12 of 33 positions shown, 14 images · non-contrast
Comparison: None.

CLINICAL DATA: Pt had syncopal episode on bus after giving plasma.
Witnesses stated pt hit head 2 times. EMS BP 50s systolic on scene
pt bp 105/76 on arrival.

EXAM:
CT HEAD WITHOUT CONTRAST
CT CERVICAL SPINE WITHOUT CONTRAST
TECHNIQUE: Multidetector CT imaging of the head and cervical spine was
performed following the standard protocol without intravenous
contrast. Multiplanar CT image reconstructions of the cervical spine
were also generated.

[Series 4: sagittal bone · sagittal · 0.27mm/px · 5 of 62 slices shown, 6 images]
[im 21/62  bone]
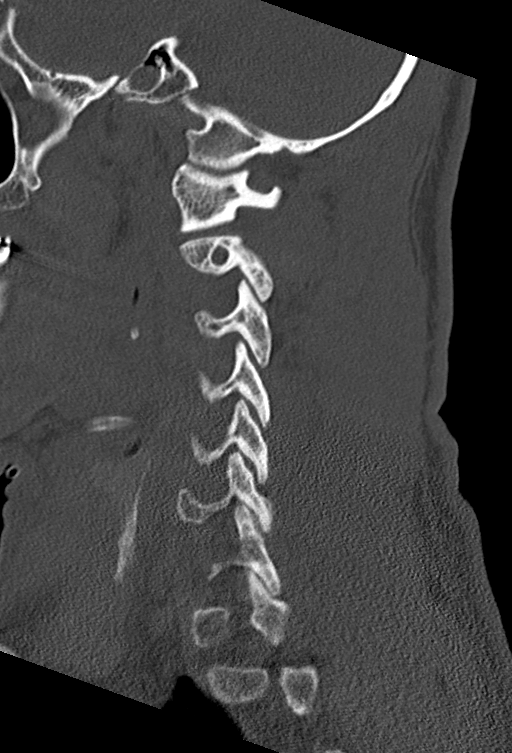
[im 26/62  bone]
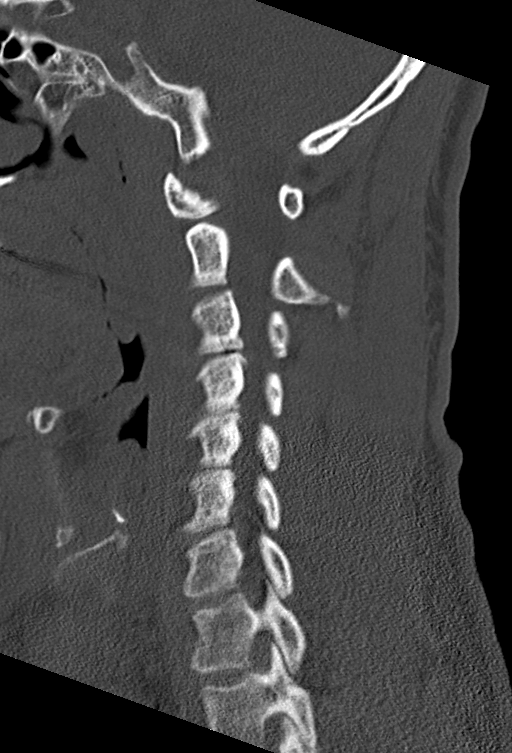
[im 31/62  soft-tissue]
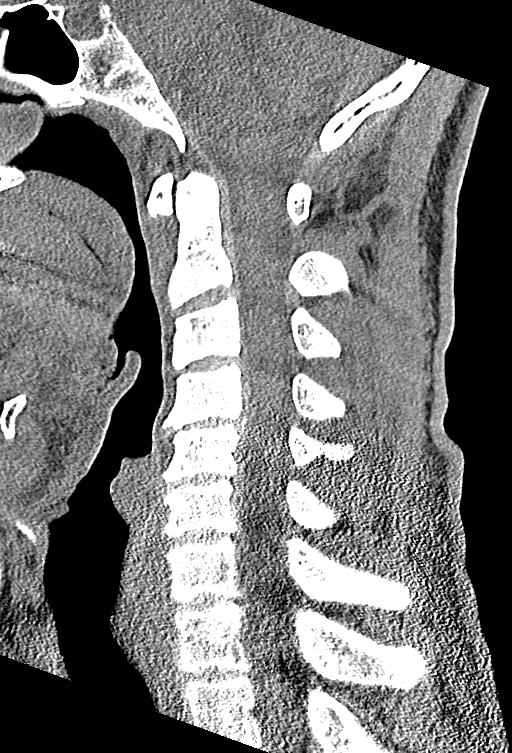
[im 31/62  bone]
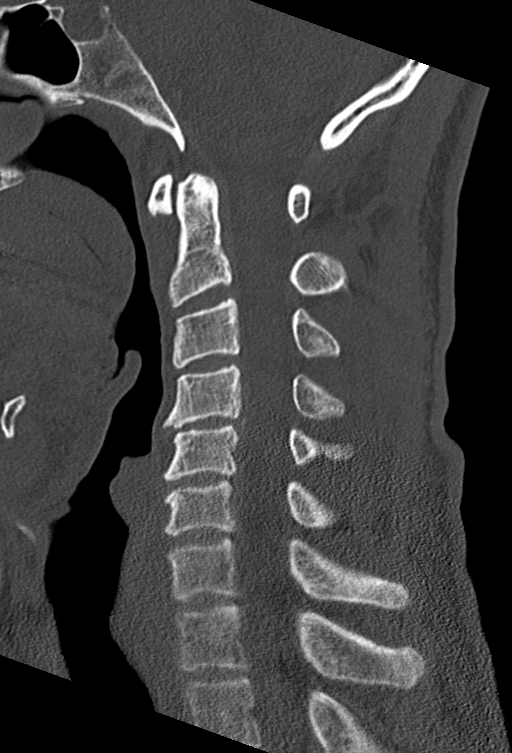
[im 36/62  bone]
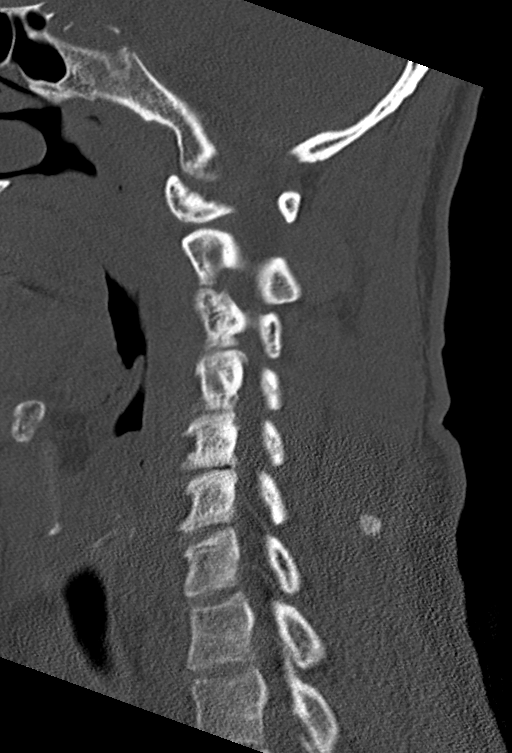
[im 41/62  bone]
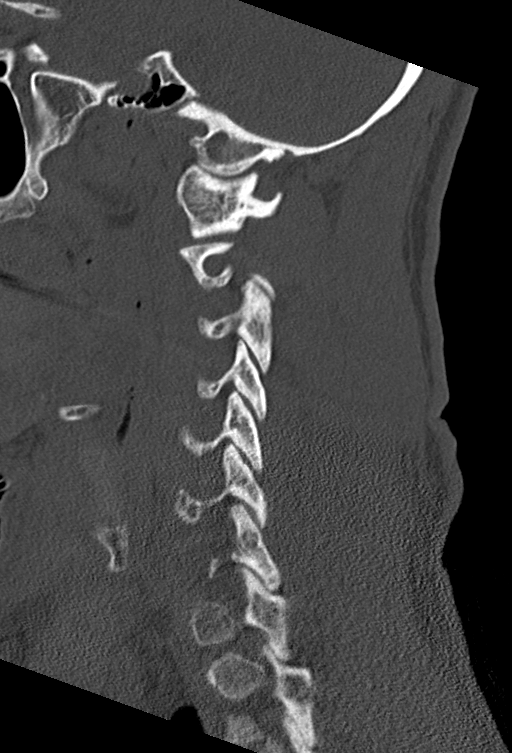

[Series 5: coronal bone · coronal · 0.22mm/px · 3 of 47 slices shown]
[im 10/47  bone]
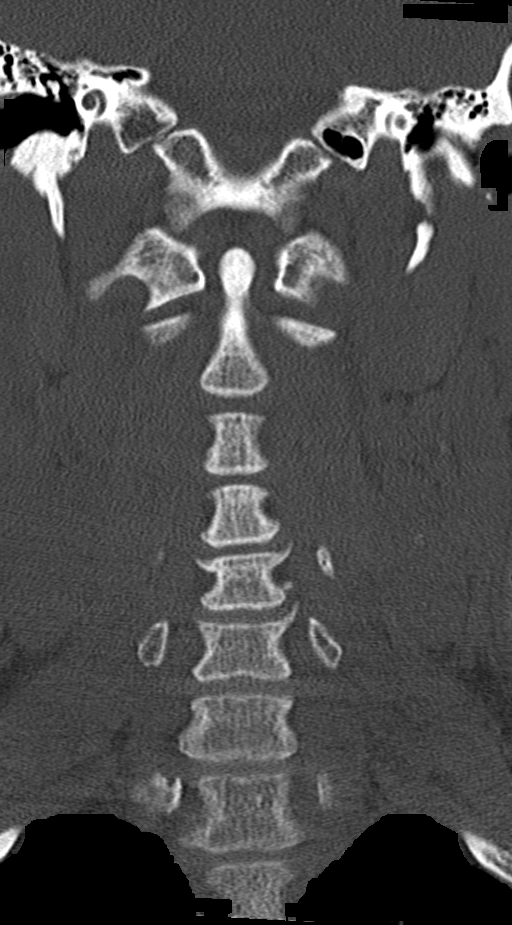
[im 19/47  bone]
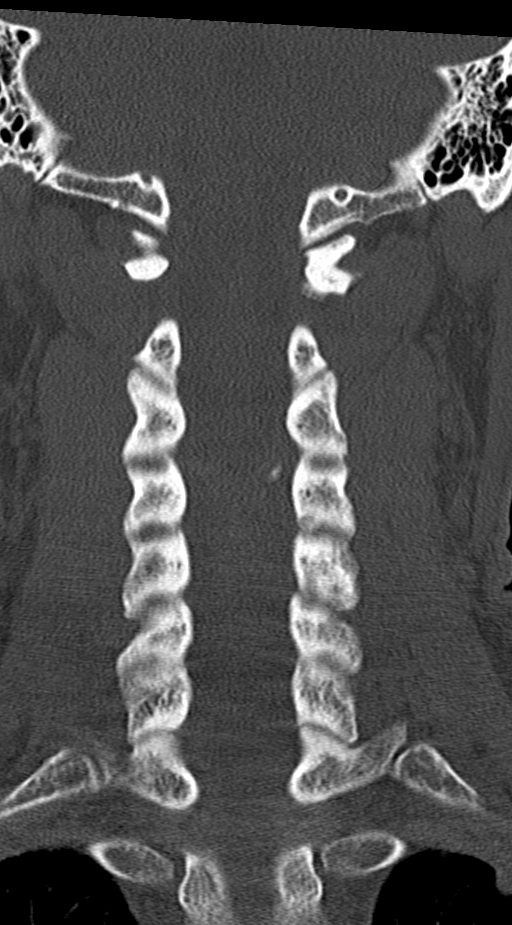
[im 28/47  bone]
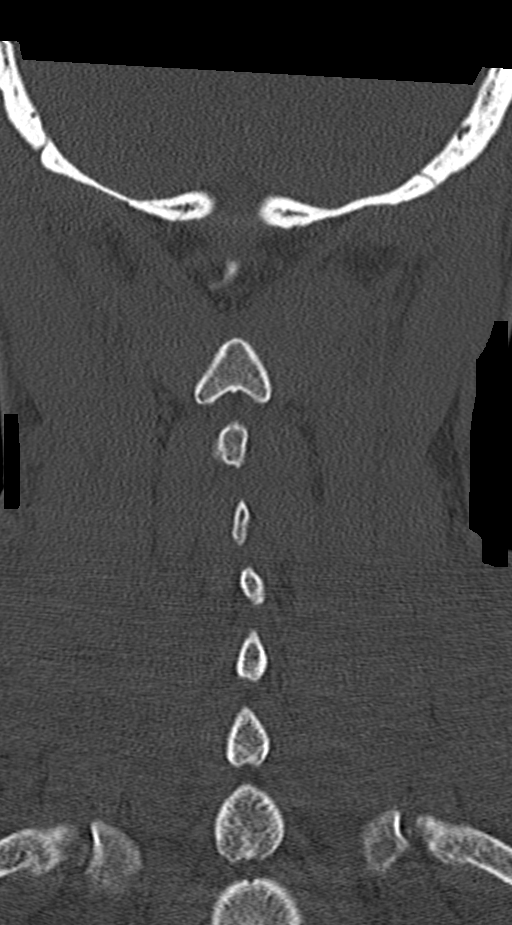

[Series 6: orthogonal bone · axial · 0.25mm/px · z∈[-17,+112]mm · 4 of 104 slices shown, 5 images]
[im 18/104  soft-tissue]
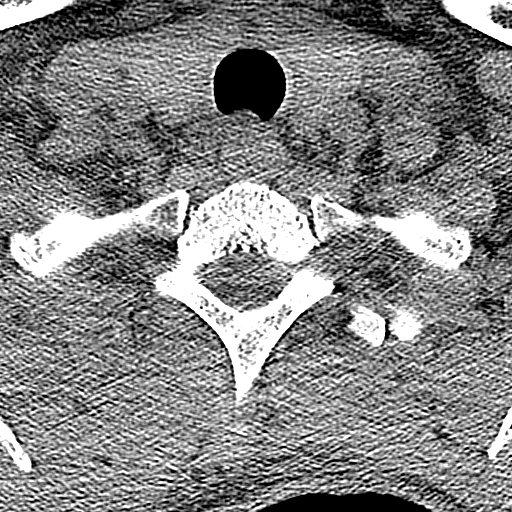
[im 18/104  bone]
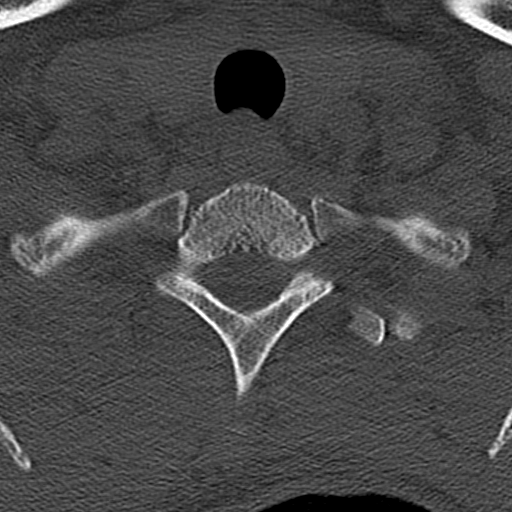
[im 35/104  bone]
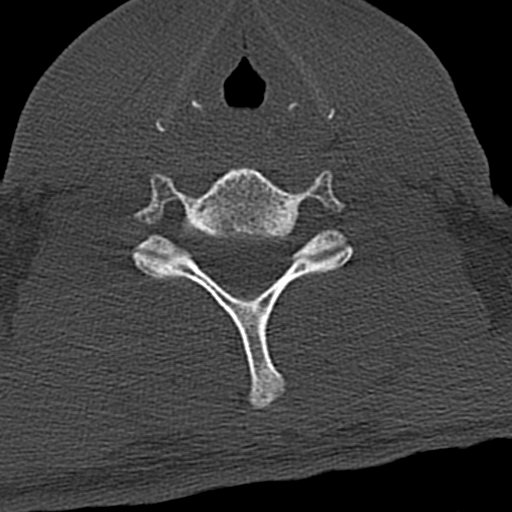
[im 69/104  bone]
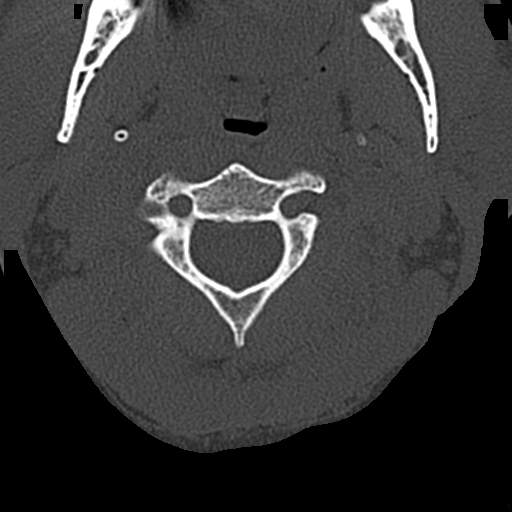
[im 86/104  bone]
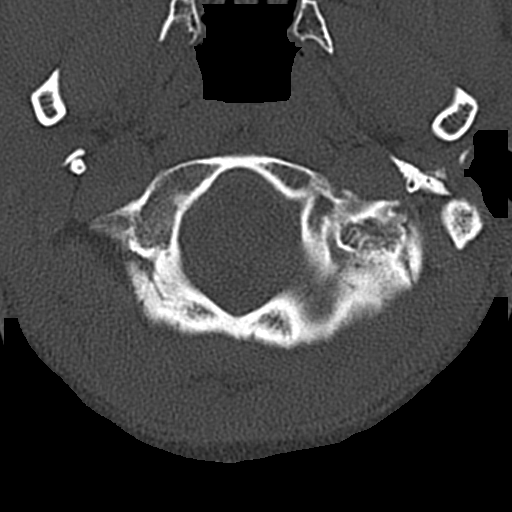

[12 of 33 positions shown; findings below may reference images not displayed]

FINDINGS: CT HEAD FINDINGS

Brain:

No evidence of large-territorial acute infarction. No parenchymal
hemorrhage. No mass lesion. No extra-axial collection.

No mass effect or midline shift. No hydrocephalus. Basilar cisterns
are patent.

Vascular: No hyperdense vessel.

Skull: No acute fracture or focal lesion.

Sinuses/Orbits: Paranasal sinuses and mastoid air cells are clear.
The orbits are unremarkable.

Other: None.

CT CERVICAL SPINE FINDINGS

Alignment: Reversal of the normal cervical lordosis likely due to
positioning.

Skull base and vertebrae: Mild multilevel degenerative changes of
the spine. No acute fracture. No aggressive appearing focal osseous
lesion or focal pathologic process.

Soft tissues and spinal canal: No prevertebral fluid or swelling. No
visible canal hematoma.

Upper chest: Paraseptal emphysematous changes.

Other: None.
IMPRESSION: 1. No acute intracranial abnormality.
2. No acute displaced fracture or traumatic listhesis of the
cervical spine.
3.  Emphysema ([id number]).

## 2023-02-14 IMAGING — CT CT HEAD W/O CM
3 series · 15 of 47 positions shown, 18 images · non-contrast
Comparison: None.

CLINICAL DATA: Pt had syncopal episode on bus after giving plasma.
Witnesses stated pt hit head 2 times. EMS BP 50s systolic on scene
pt bp 105/76 on arrival.

EXAM:
CT HEAD WITHOUT CONTRAST
CT CERVICAL SPINE WITHOUT CONTRAST
TECHNIQUE: Multidetector CT imaging of the head and cervical spine was
performed following the standard protocol without intravenous
contrast. Multiplanar CT image reconstructions of the cervical spine
were also generated.

[Series 2: head wo · axial · 0.41mm/px · z∈[+137,+262]mm · 9 of 30 slices shown, 12 images]
[im 3/30  brain]
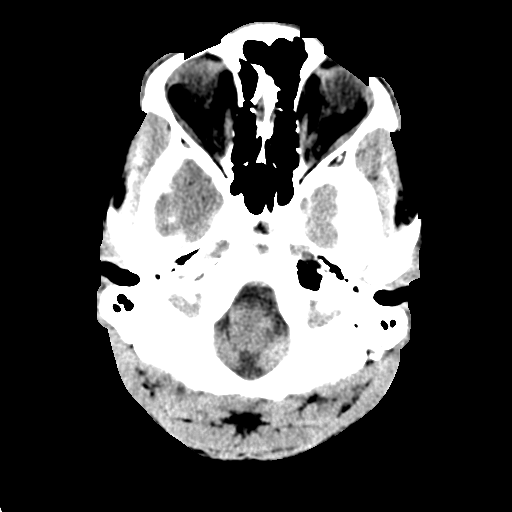
[im 3/30  bone]
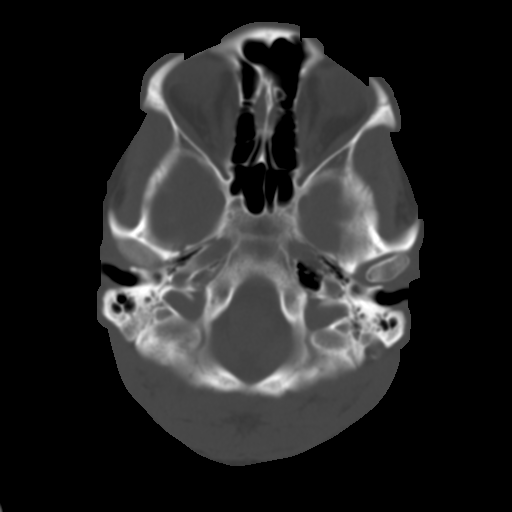
[im 6/30  brain]
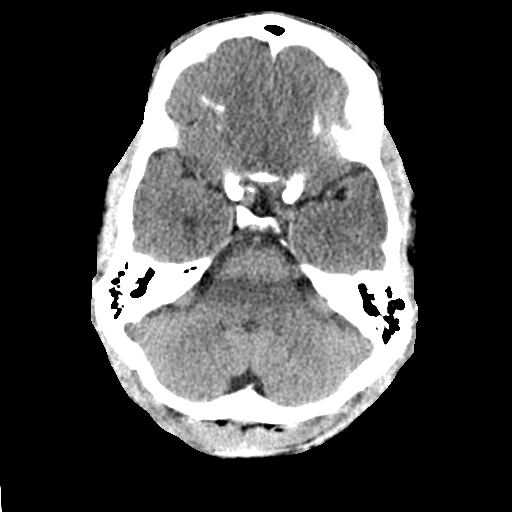
[im 9/30  brain]
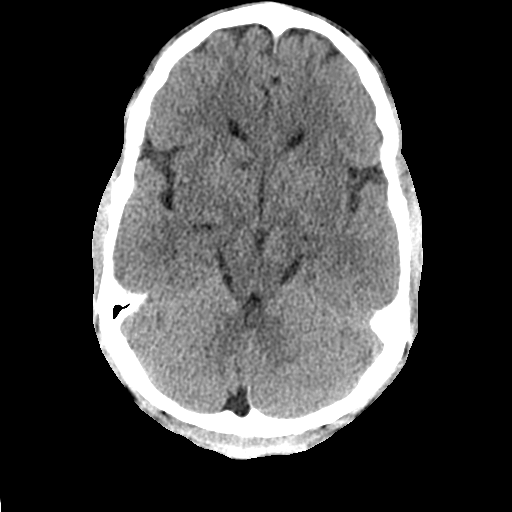
[im 12/30  brain]
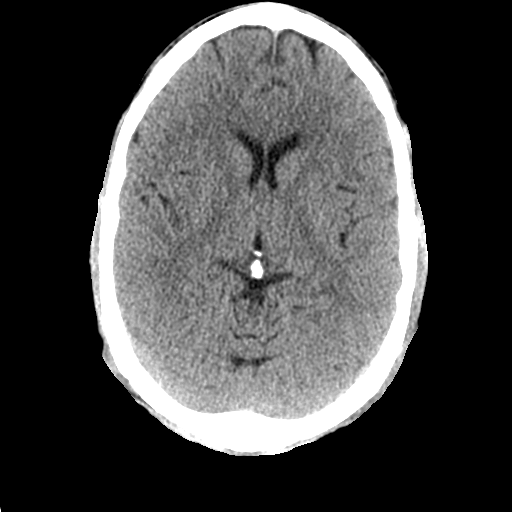
[im 16/30  brain]
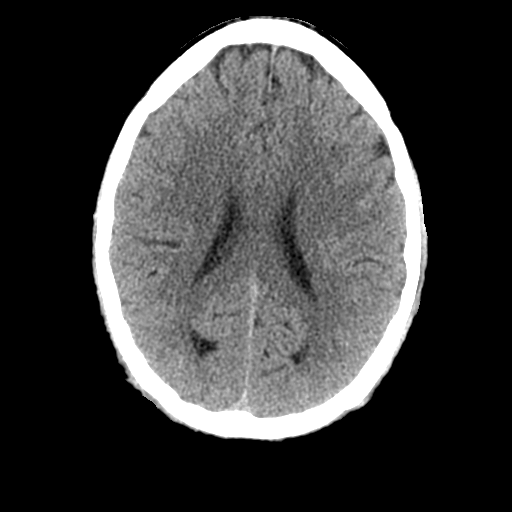
[im 16/30  bone]
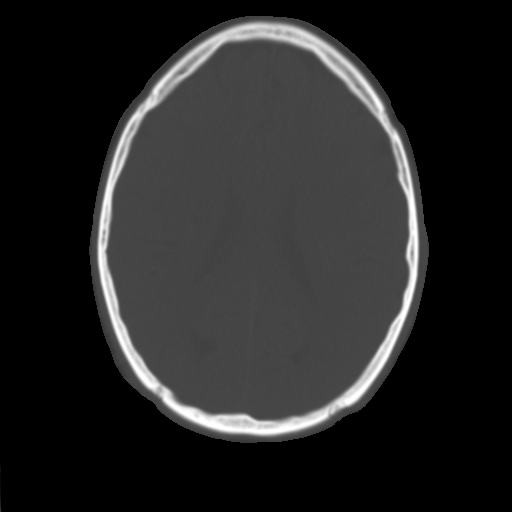
[im 19/30  brain]
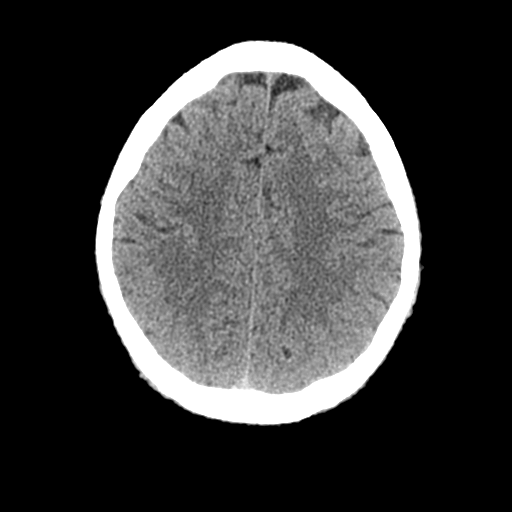
[im 22/30  brain]
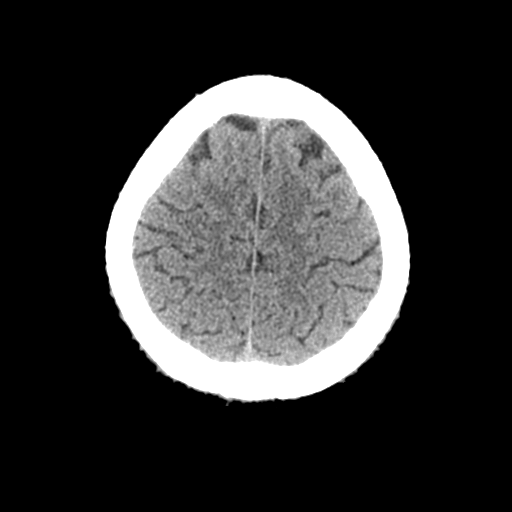
[im 25/30  brain]
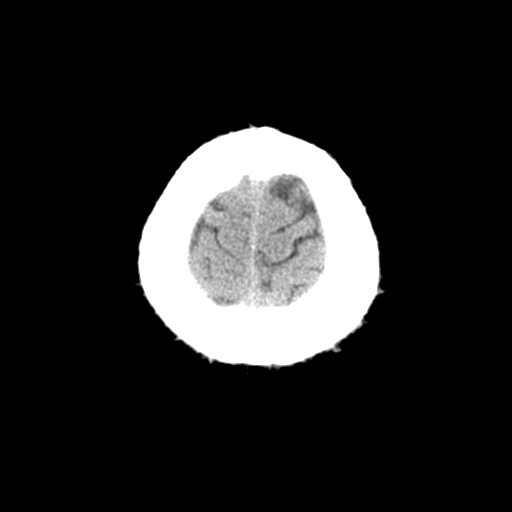
[im 28/30  brain]
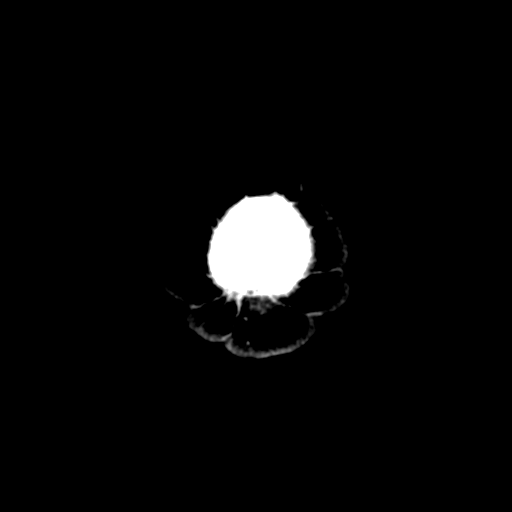
[im 28/30  bone]
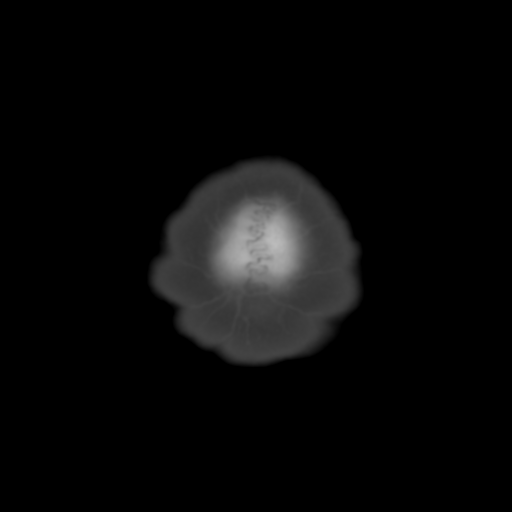

[Series 4: coronal soft tissue · coronal · 0.31mm/px · 3 of 68 slices shown]
[im 23/68  brain]
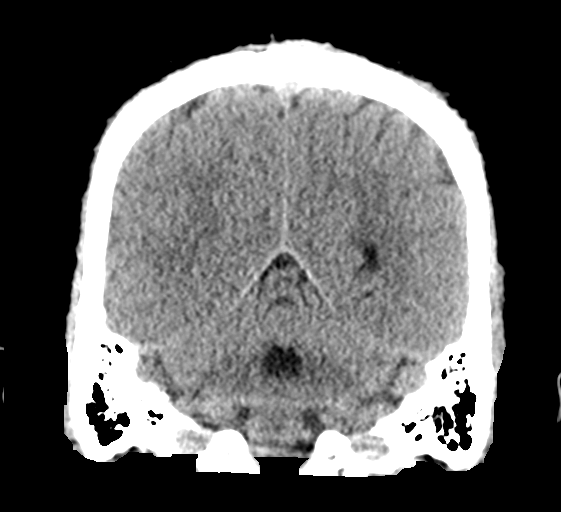
[im 30/68  brain]
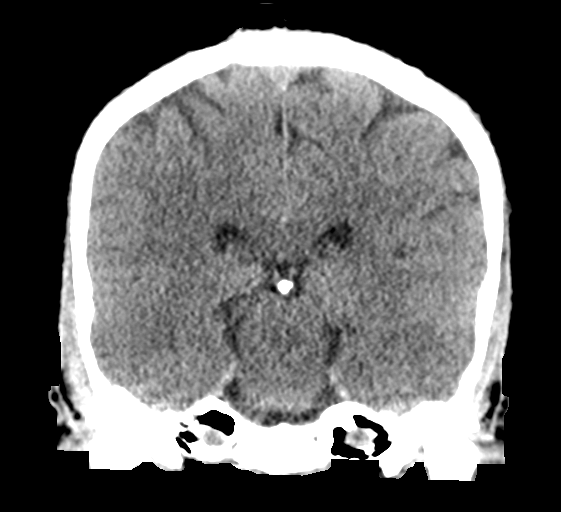
[im 38/68  brain]
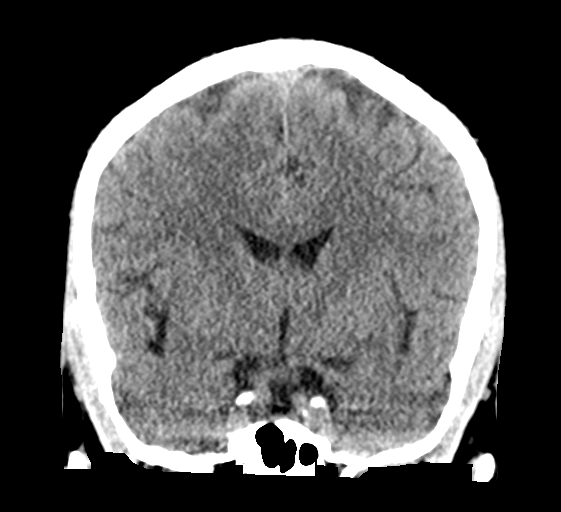

[Series 5: sagittal soft tissue · sagittal · 0.31mm/px · 3 of 55 slices shown]
[im 19/55  brain]
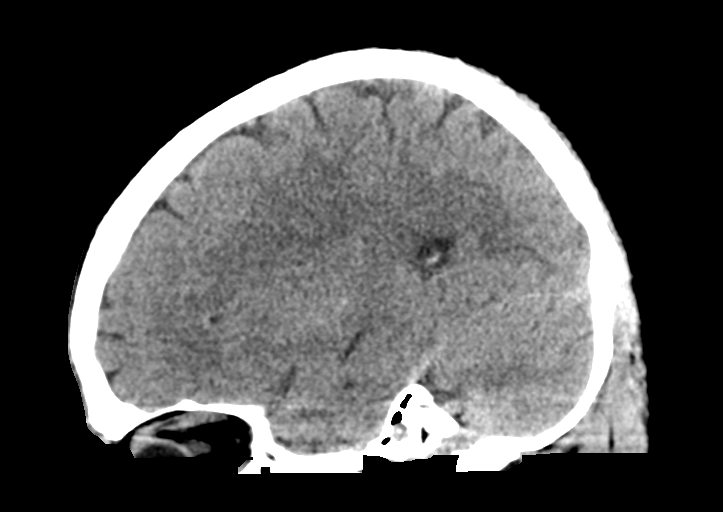
[im 28/55  brain]
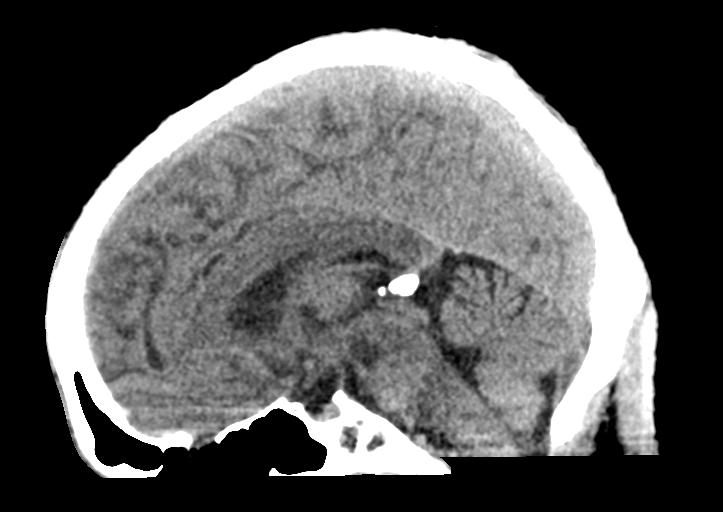
[im 37/55  brain]
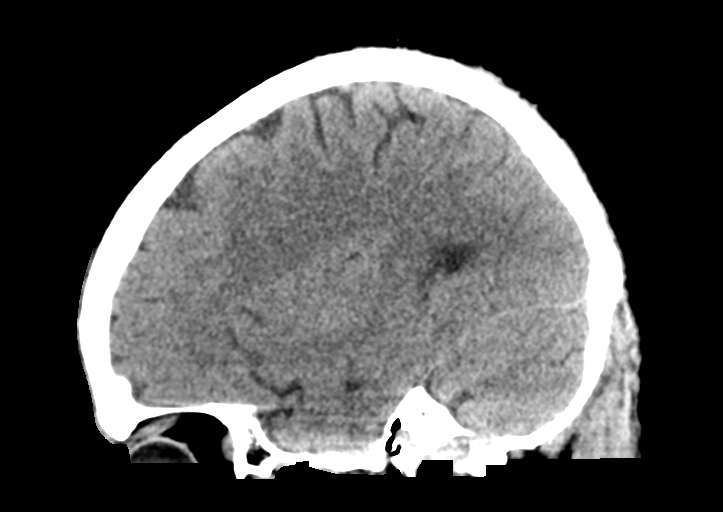

[15 of 47 positions shown; findings below may reference images not displayed]

FINDINGS: CT HEAD FINDINGS

Brain:

No evidence of large-territorial acute infarction. No parenchymal
hemorrhage. No mass lesion. No extra-axial collection.

No mass effect or midline shift. No hydrocephalus. Basilar cisterns
are patent.

Vascular: No hyperdense vessel.

Skull: No acute fracture or focal lesion.

Sinuses/Orbits: Paranasal sinuses and mastoid air cells are clear.
The orbits are unremarkable.

Other: None.

CT CERVICAL SPINE FINDINGS

Alignment: Reversal of the normal cervical lordosis likely due to
positioning.

Skull base and vertebrae: Mild multilevel degenerative changes of
the spine. No acute fracture. No aggressive appearing focal osseous
lesion or focal pathologic process.

Soft tissues and spinal canal: No prevertebral fluid or swelling. No
visible canal hematoma.

Upper chest: Paraseptal emphysematous changes.

Other: None.
IMPRESSION: 1. No acute intracranial abnormality.
2. No acute displaced fracture or traumatic listhesis of the
cervical spine.
3.  Emphysema ([id number]).

## 2023-02-14 IMAGING — CT CT ANGIO CHEST
2 of 6 series · 19 of 46 positions shown · IV contrast (omnipaque)
Comparison: Chest x-ray 04/19/2021

CLINICAL DATA: Syncopal episode

EXAM:
CT ANGIOGRAPHY CHEST WITH CONTRAST
TECHNIQUE: Multidetector CT imaging of the chest was performed using the
standard protocol during bolus administration of intravenous
contrast. Multiplanar CT image reconstructions and MIPs were
obtained to evaluate the vascular anatomy.
CONTRAST:  75mL OMNIPAQUE IOHEXOL 350 MG/ML SOLN

[Series 5: thins · axial · 0.66mm/px · z∈[-346,-63]mm · 16 of 388 slices shown]
[im 17/388  lung]
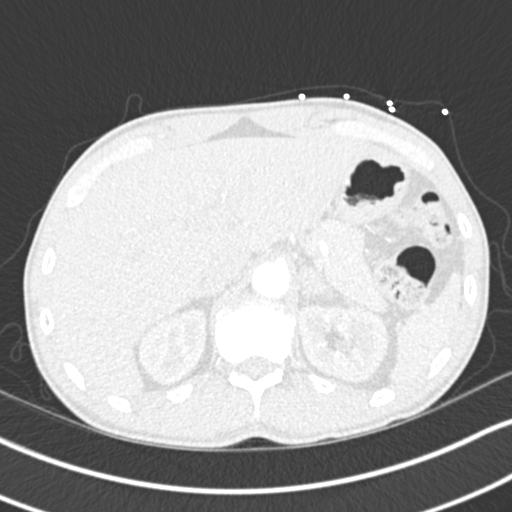
[im 49/388  soft-tissue]
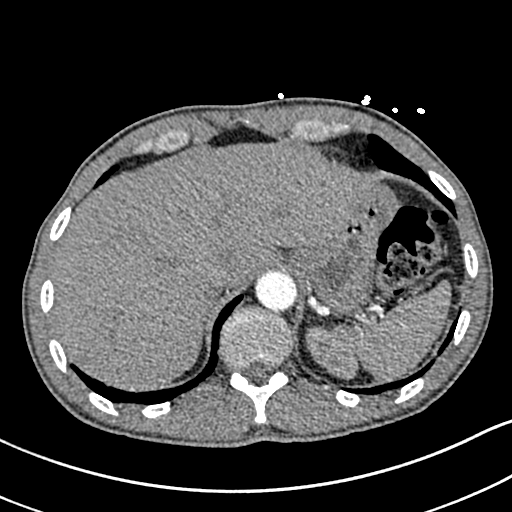
[im 65/388  lung]
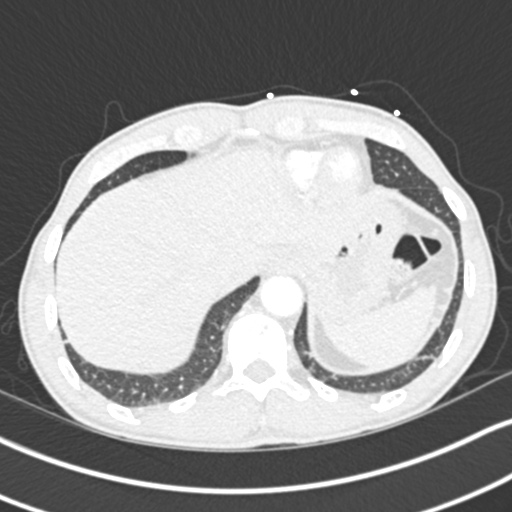
[im 97/388  soft-tissue]
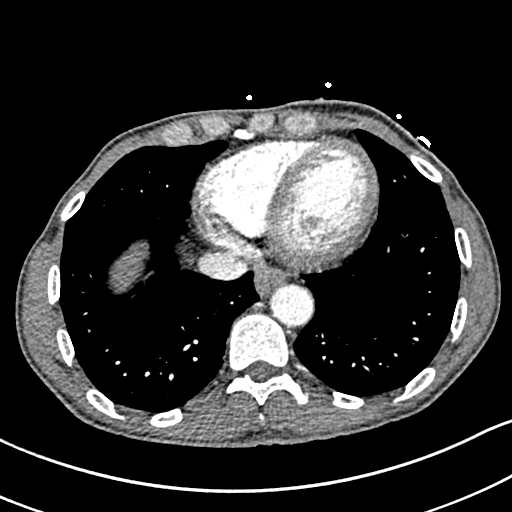
[im 113/388  lung]
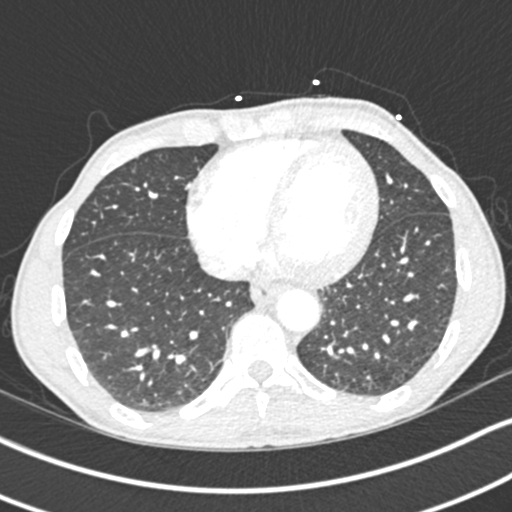
[im 130/388  soft-tissue]
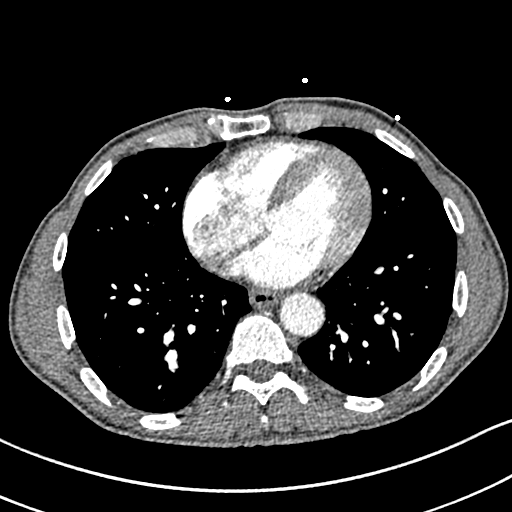
[im 162/388  lung]
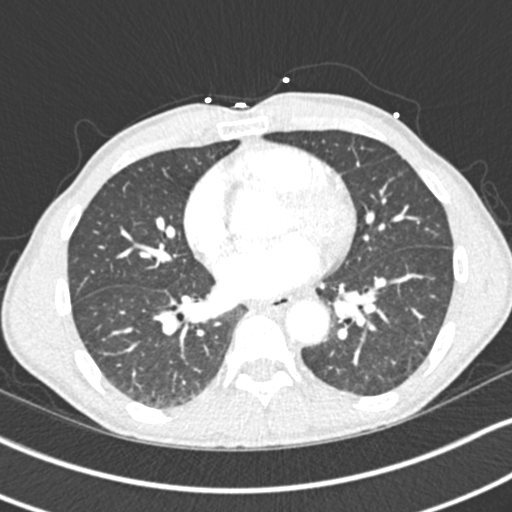
[im 178/388  soft-tissue]
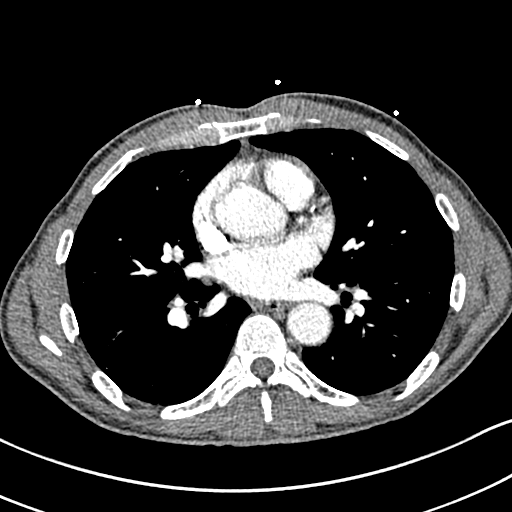
[im 210/388  lung]
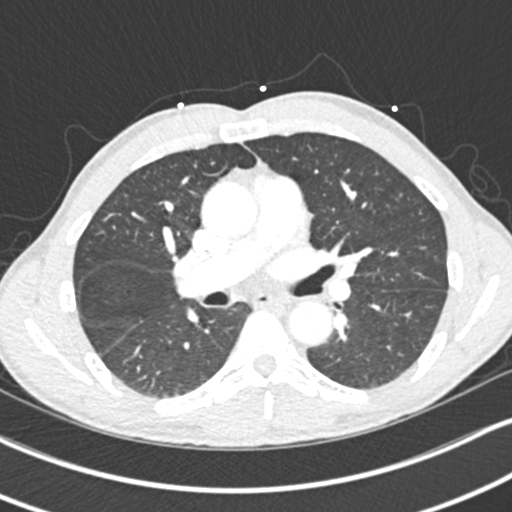
[im 226/388  soft-tissue]
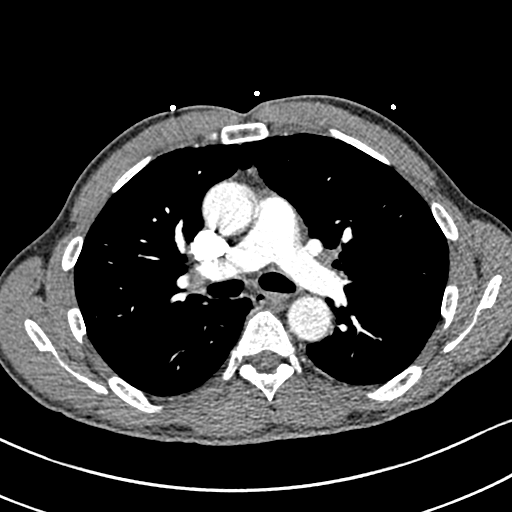
[im 259/388  lung]
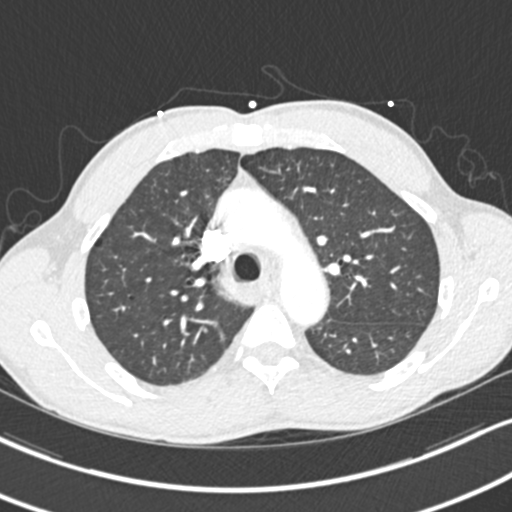
[im 275/388  soft-tissue]
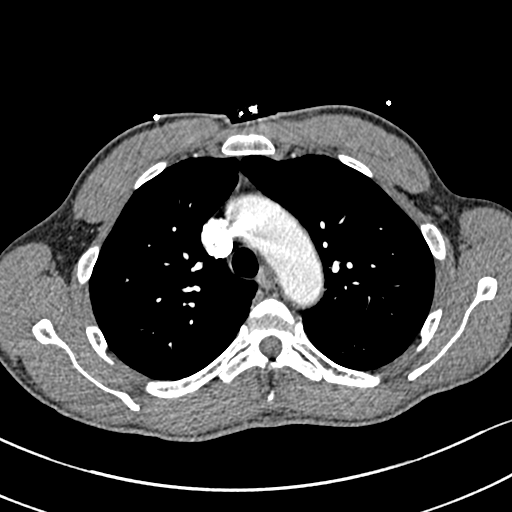
[im 291/388  lung]
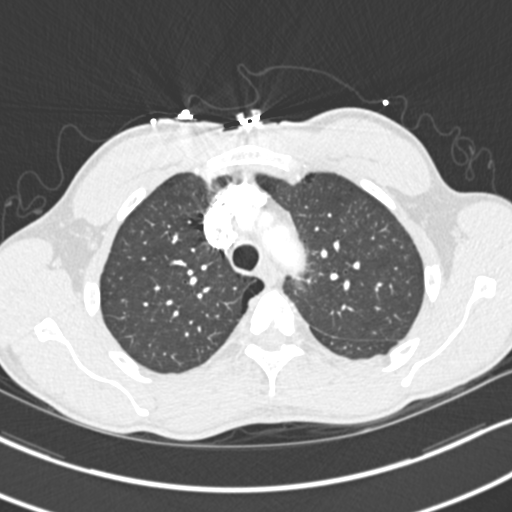
[im 323/388  soft-tissue]
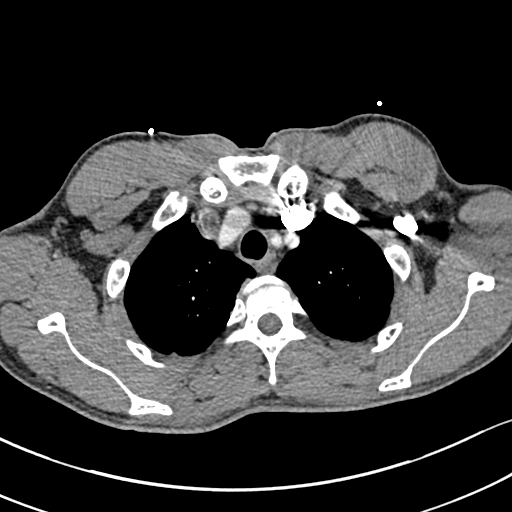
[im 339/388  lung]
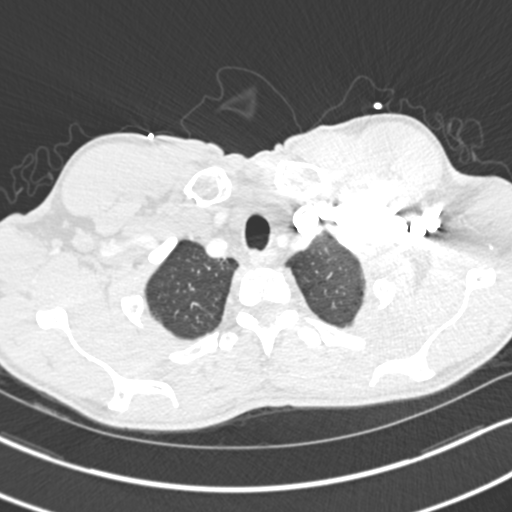
[im 371/388  soft-tissue]
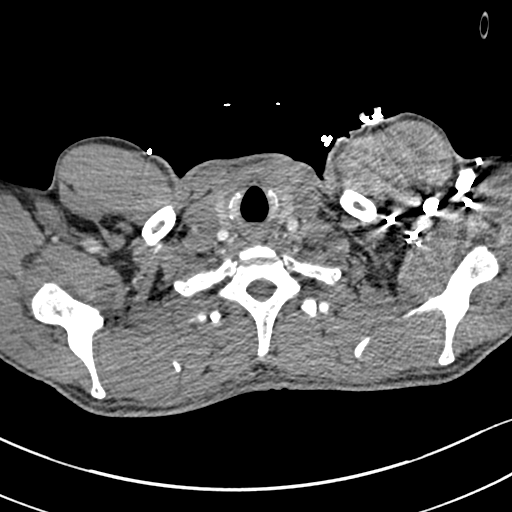

[Series 7: coronal mpr · coronal · 0.63mm/px · 3 of 77 slices shown]
[im 20/77  soft-tissue]
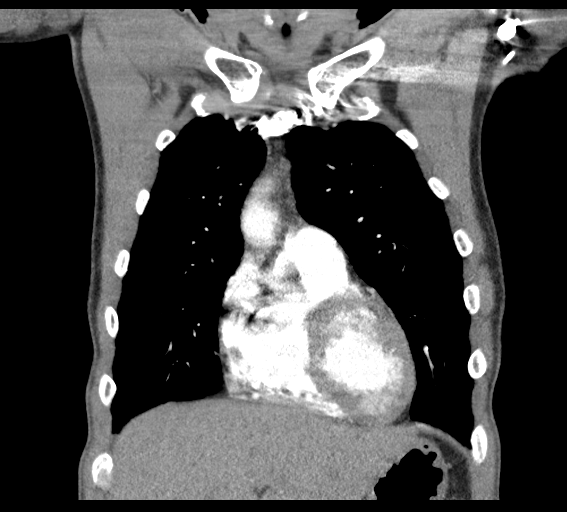
[im 39/77  soft-tissue]
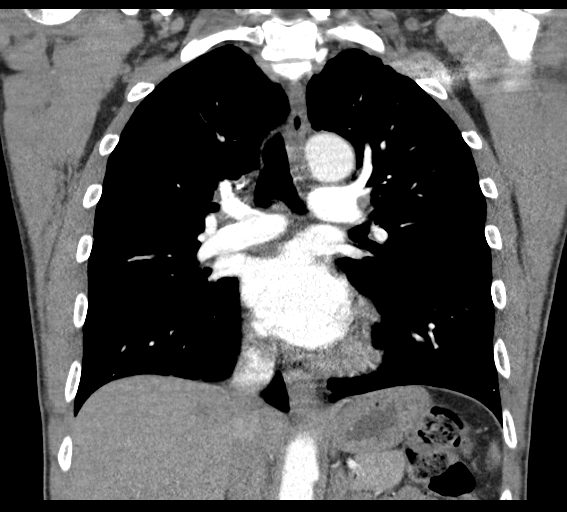
[im 58/77  soft-tissue]
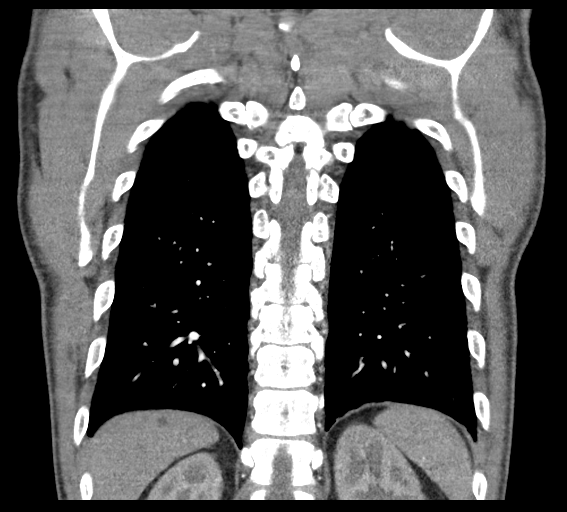

[19 of 46 positions shown; findings below may reference images not displayed]

FINDINGS: Cardiovascular: Satisfactory opacification of the pulmonary arteries
to the segmental level. No evidence of pulmonary embolism. Normal
heart size. No pericardial effusion. Mild coronary calcification.

Mediastinum/Nodes: No enlarged mediastinal, nodes. Mild right hilar
nodes. Thyroid gland, trachea, and esophagus demonstrate no
significant findings.

Lungs/Pleura: Lungs are clear. No pleural effusion or pneumothorax.
Punctate calcified granuloma in the right upper lobe.

Upper Abdomen: No acute abnormality. Subcentimeter hypodensity in
the right hepatic lobe too small to further characterize.

Musculoskeletal: No chest wall abnormality. No acute or significant
osseous findings.

Review of the MIP images confirms the above findings.
IMPRESSION: 1. No CT evidence for acute pulmonary embolus or aortic dissection.
2. Clear lung fields
